# Patient Record
Sex: Female | Born: 1990 | Race: Black or African American | Hispanic: No | Smoking: Current every day smoker
Health system: Southern US, Community
[De-identification: ages and names within clinical notes are randomized; demographics above are authoritative.]

## PROBLEM LIST (undated history)

## (undated) DIAGNOSIS — Z789 Other specified health status: Secondary | ICD-10-CM

## (undated) HISTORY — PX: NO PAST SURGERIES: SHX2092

## (undated) HISTORY — DX: Other specified health status: Z78.9

---

## 2015-06-20 ENCOUNTER — Encounter (HOSPITAL_BASED_OUTPATIENT_CLINIC_OR_DEPARTMENT_OTHER): Payer: Self-pay | Admitting: *Deleted

## 2015-06-20 ENCOUNTER — Emergency Department (HOSPITAL_BASED_OUTPATIENT_CLINIC_OR_DEPARTMENT_OTHER)
Admission: EM | Admit: 2015-06-20 | Discharge: 2015-06-20 | Disposition: A | Discharge status: Home / Self Care | Payer: Medicaid Other | Attending: Emergency Medicine | Admitting: Emergency Medicine

## 2015-06-20 DIAGNOSIS — S8992XD Unspecified injury of left lower leg, subsequent encounter: Secondary | ICD-10-CM | POA: Diagnosis not present

## 2015-06-20 DIAGNOSIS — S3992XD Unspecified injury of lower back, subsequent encounter: Secondary | ICD-10-CM | POA: Insufficient documentation

## 2015-06-20 DIAGNOSIS — S199XXD Unspecified injury of neck, subsequent encounter: Secondary | ICD-10-CM | POA: Insufficient documentation

## 2015-06-20 DIAGNOSIS — S4992XD Unspecified injury of left shoulder and upper arm, subsequent encounter: Secondary | ICD-10-CM | POA: Insufficient documentation

## 2015-06-20 DIAGNOSIS — Z79899 Other long term (current) drug therapy: Secondary | ICD-10-CM | POA: Insufficient documentation

## 2015-06-20 DIAGNOSIS — M25512 Pain in left shoulder: Secondary | ICD-10-CM

## 2015-06-20 DIAGNOSIS — M25562 Pain in left knee: Secondary | ICD-10-CM
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
## 2019-07-13 ENCOUNTER — Emergency Department (HOSPITAL_COMMUNITY)
Admission: EM | Admit: 2019-07-13 | Discharge: 2019-07-13 | Disposition: A | Discharge status: Home / Self Care | Payer: Self-pay | Attending: Emergency Medicine | Admitting: Emergency Medicine

## 2019-07-13 ENCOUNTER — Encounter (HOSPITAL_COMMUNITY): Payer: Self-pay | Admitting: *Deleted

## 2019-07-13 DIAGNOSIS — R1111 Vomiting without nausea: Secondary | ICD-10-CM | POA: Diagnosis not present

## 2019-07-13 DIAGNOSIS — R1084 Generalized abdominal pain: Secondary | ICD-10-CM | POA: Insufficient documentation

## 2019-07-13 DIAGNOSIS — R197 Diarrhea, unspecified: Secondary | ICD-10-CM | POA: Diagnosis not present

## 2019-07-13 DIAGNOSIS — R110 Nausea: Secondary | ICD-10-CM | POA: Diagnosis not present

## 2019-07-13 DIAGNOSIS — R112 Nausea with vomiting, unspecified: Secondary | ICD-10-CM | POA: Insufficient documentation

## 2019-07-13 DIAGNOSIS — R52 Pain, unspecified: Secondary | ICD-10-CM | POA: Diagnosis not present

## 2019-07-13 LAB — URINALYSIS, ROUTINE W REFLEX MICROSCOPIC
Bacteria, UA: NONE SEEN
Bilirubin Urine: NEGATIVE
Glucose, UA: NEGATIVE mg/dL
Ketones, ur: 20 mg/dL — AB
Nitrite: NEGATIVE
Protein, ur: 30 mg/dL — AB
RBC / HPF: >50 RBC/hpf — ABNORMAL HIGH (ref 0–5)
Specific Gravity, Urine: 1.021 (ref 1.005–1.030)
pH: 7 (ref 5.0–8.0)

## 2019-07-13 LAB — CBC WITH DIFFERENTIAL/PLATELET
Abs Immature Granulocytes: 0.08 10*3/uL — ABNORMAL HIGH (ref 0.00–0.07)
Basophils Absolute: 0 10*3/uL (ref 0.0–0.1)
Basophils Relative: 0 %
Eosinophils Absolute: 0 10*3/uL (ref 0.0–0.5)
Eosinophils Relative: 0 %
HCT: 44.7 % (ref 36.0–46.0)
Hemoglobin: 14.5 g/dL (ref 12.0–15.0)
Immature Granulocytes: 1 %
Lymphocytes Relative: 8 %
Lymphs Abs: 0.9 10*3/uL (ref 0.7–4.0)
MCH: 29.4 pg (ref 26.0–34.0)
MCHC: 32.4 g/dL (ref 30.0–36.0)
MCV: 90.5 fL (ref 80.0–100.0)
Monocytes Absolute: 0.6 10*3/uL (ref 0.1–1.0)
Monocytes Relative: 5 %
Neutro Abs: 10.4 10*3/uL — ABNORMAL HIGH (ref 1.7–7.7)
Neutrophils Relative %: 86 %
Platelets: 263 10*3/uL (ref 150–400)
RBC: 4.94 MIL/uL (ref 3.87–5.11)
RDW: 13.1 % (ref 11.5–15.5)
WBC: 12 10*3/uL — ABNORMAL HIGH (ref 4.0–10.5)
nRBC: 0 % (ref 0.0–0.2)

## 2019-07-13 LAB — I-STAT BETA HCG BLOOD, ED (MC, WL, AP ONLY): I-stat hCG, quantitative: <5 m[IU]/mL (ref <5)

## 2019-07-13 LAB — COMPREHENSIVE METABOLIC PANEL
ALT: 18 U/L (ref 0–44)
AST: 26 U/L (ref 15–41)
Albumin: 4.2 g/dL (ref 3.5–5.0)
Alkaline Phosphatase: 47 U/L (ref 38–126)
Anion gap: 12 (ref 5–15)
BUN: 10 mg/dL (ref 6–20)
CO2: 19 mmol/L — ABNORMAL LOW (ref 22–32)
Calcium: 9.4 mg/dL (ref 8.9–10.3)
Chloride: 107 mmol/L (ref 98–111)
Creatinine, Ser: 0.98 mg/dL (ref 0.44–1.00)
GFR calc Af Amer: >60 mL/min (ref >60)
GFR calc non Af Amer: >60 mL/min (ref >60)
Glucose, Bld: 141 mg/dL — ABNORMAL HIGH (ref 70–99)
Potassium: 3.2 mmol/L — ABNORMAL LOW (ref 3.5–5.1)
Sodium: 138 mmol/L (ref 135–145)
Total Bilirubin: 0.6 mg/dL (ref 0.3–1.2)
Total Protein: 7.9 g/dL (ref 6.5–8.1)

## 2019-07-13 LAB — LIPASE, BLOOD: Lipase: 25 U/L (ref 11–51)

## 2019-07-13 MED ORDER — CAPSAICIN 0.025 % EX CREA
TOPICAL_CREAM | Freq: Once | CUTANEOUS | Status: AC
  Administered 2019-07-13: 14:00:00 via TOPICAL
  Filled 2019-07-13: qty 60

## 2019-07-13 MED ORDER — POTASSIUM CHLORIDE CRYS ER 20 MEQ PO TBCR
40.0000 meq | EXTENDED_RELEASE_TABLET | Freq: Once | ORAL | Status: AC
  Administered 2019-07-13: 40 meq via ORAL
  Filled 2019-07-13: qty 2

## 2019-07-13 MED ORDER — SODIUM CHLORIDE 0.9 % IV BOLUS
1000.0000 mL | Freq: Once | INTRAVENOUS | Status: AC
  Administered 2019-07-13: 1000 mL via INTRAVENOUS

## 2019-07-13 MED ORDER — METOCLOPRAMIDE HCL 10 MG PO TABS: 10.0000 mg | ORAL_TABLET | Freq: Three times a day (TID) | ORAL | 0 refills | Status: DC | PRN

## 2019-07-13 MED ORDER — ONDANSETRON HCL 4 MG/2ML IJ SOLN
4.0000 mg | Freq: Once | INTRAMUSCULAR | Status: AC
  Administered 2019-07-13: 4 mg via INTRAVENOUS
  Filled 2019-07-13: qty 2

## 2019-07-13 MED ORDER — METOCLOPRAMIDE HCL 5 MG/ML IJ SOLN
10.0000 mg | Freq: Once | INTRAMUSCULAR | Status: AC
  Administered 2019-07-13: 10 mg via INTRAVENOUS
  Filled 2019-07-13: qty 2

## 2019-07-13 MED ORDER — ACETAMINOPHEN 325 MG PO TABS
650.0000 mg | ORAL_TABLET | Freq: Once | ORAL | Status: AC
  Administered 2019-07-13: 650 mg via ORAL
  Filled 2019-07-13: qty 2

## 2019-07-13 NOTE — ED Triage Notes (Signed)
Per EMS, pt from home complains of emesis, diarrhea, generalized abdominal pain since this morning. Pt vomited yellow bile twice w/ EMS. Pt had negative pregnancy test last week but is unsure if she is pregnant. No fevers or sick contacts.   BP 124/76 HR 82 RR 20 O2 97% CBG 165 Temp 97.2

## 2019-07-13 NOTE — Discharge Instructions (Addendum)
Take Reglan every 6 hours as needed for nausea or vomiting.  Stick with clear liquids and then progress your diet as tolerated.  Begin with bland foods such as bananas, rice, applesauce, toast.  Avoid greasy, fatty, spicy, fried foods until you tolerate bland foods.  Make sure to stay well-hydrated.  Please return the emergency department if you develop any new or worsening symptoms including intractable vomiting, severe localized abdominal pain, fever, or any other new concerning symptoms.  I recommend having your blood sugar rechecked when you see your primary care provider next visit was mildly elevated today if you had been fasting prior to your blood draw.

## 2019-07-13 NOTE — ED Provider Notes (Signed)
[facility]-EMERGENCY DEPT Provider Note   CSN: [id number] Arrival date & time: 07/13/19  [id number]    History   Chief Complaint Chief Complaint  Patient presents with  . Abdominal Pain  . Emesis  . Diarrhea    HPI Olivia Mcfarland is a 28 y.o. female who is previously healthy who presents with nausea, vomiting, diarrhea, lower abdominal cramping that began around 5 AM this morning.  Patient reports vomiting about 20 times and having 2 episodes of nonbloody diarrhea.  Patient reports feeling fine before she went to bed last night.  Patient reports eating [employer] around 6 PM and did not really sit right.  Patient denies any fevers, chest pain, shortness of breath, urinary symptoms, abnormal vaginal bleeding or discharge.  She is on her menstrual cycle, which she states is late.  She took a pregnancy test last week and was negative.  Patient reports using marijuana daily.     HPI  History reviewed. No pertinent past medical history.  There are no active problems to display for this patient.   History reviewed. No pertinent surgical history.   OB History   No obstetric history on file.      Home Medications    Prior to Admission medications   Medication Sig Start Date End Date Taking? Authorizing Provider  ibuprofen (ADVIL,MOTRIN) 800 MG tablet Take 800 mg by mouth every 8 (eight) hours as needed.    [provider]  methocarbamol (ROBAXIN) 500 MG tablet Take 500 mg by mouth 4 (four) times daily.    [provider]  metoCLOPramide (REGLAN) 10 MG tablet Take 1 tablet (10 mg total) by mouth every 8 (eight) hours as needed for nausea. 07/13/19   Emi HolesLaw, Rafan Sanders M, PA-C    Family History No family history on file.  Social History Social History   Tobacco Use  . Smoking status: Never Smoker  . Smokeless tobacco: Never Used  Substance Use Topics  . Alcohol use: No  . Drug use: Not on file     Allergies   Patient has no known  allergies.   Review of Systems Review of Systems  Constitutional: Negative for chills and fever.  HENT: Negative for facial swelling and sore throat.   Respiratory: Negative for shortness of breath.   Cardiovascular: Negative for chest pain.  Gastrointestinal: Positive for abdominal pain, diarrhea, nausea and vomiting. Negative for blood in stool.  Genitourinary: Positive for vaginal bleeding (menstrual cycle). Negative for dysuria and vaginal discharge.  Musculoskeletal: Negative for back pain.  Skin: Negative for rash and wound.  Neurological: Negative for headaches.  Psychiatric/Behavioral: The patient is not nervous/anxious.      Physical Exam Updated Vital Signs BP 136/74   Pulse 70   Temp 98.3 F (36.8 C) (Oral)   Resp 20   LMP 05/22/2019   SpO2 100%   Physical Exam Vitals signs and nursing note reviewed.  Constitutional:      General: She is not in acute distress.    Appearance: She is well-developed. She is not diaphoretic.  HENT:     Head: Normocephalic and atraumatic.     Mouth/Throat:     Pharynx: No oropharyngeal exudate.  Eyes:     General: No scleral icterus.       Right eye: No discharge.        Left eye: No discharge.     Conjunctiva/sclera: Conjunctivae normal.     Pupils: Pupils are equal, round, and reactive to light.  Neck:  Musculoskeletal: Normal range of motion and neck supple.     Thyroid: No thyromegaly.  Cardiovascular:     Rate and Rhythm: Normal rate and regular rhythm.     Heart sounds: Normal heart sounds. No murmur. No friction rub. No gallop.   Pulmonary:     Effort: Pulmonary effort is normal. No respiratory distress.     Breath sounds: Normal breath sounds. No stridor. No wheezing or rales.  Abdominal:     General: Bowel sounds are normal. There is no distension.     Palpations: Abdomen is soft.     Tenderness: There is abdominal tenderness in the suprapubic area and left lower quadrant. There is no right CVA tenderness, left  CVA tenderness, guarding or rebound. Negative signs include McBurney's sign.  Lymphadenopathy:     Cervical: No cervical adenopathy.  Skin:    General: Skin is warm and dry.     Coloration: Skin is not pale.     Findings: No rash.  Neurological:     Mental Status: She is alert.     Coordination: Coordination normal.      ED Treatments / Results  Labs (all labs ordered are listed, but only abnormal results are displayed) Labs Reviewed  COMPREHENSIVE METABOLIC PANEL - Abnormal; Notable for the following components:      Result Value   Potassium 3.2 (*)    CO2 19 (*)    Glucose, Bld 141 (*)    All other components within normal limits  CBC WITH DIFFERENTIAL/PLATELET - Abnormal; Notable for the following components:   WBC 12.0 (*)    Neutro Abs 10.4 (*)    Abs Immature Granulocytes 0.08 (*)    All other components within normal limits  URINALYSIS, ROUTINE W REFLEX MICROSCOPIC - Abnormal; Notable for the following components:   APPearance HAZY (*)    Hgb urine dipstick LARGE (*)    Ketones, ur 20 (*)    Protein, ur 30 (*)    Leukocytes,Ua TRACE (*)    RBC / HPF >50 (*)    All other components within normal limits  LIPASE, BLOOD  I-STAT BETA HCG BLOOD, ED ([facility], [facility], [facility] ONLY)    EKG EKG Interpretation  Date/Time:  Friday July 13 2019 11:47:04 EDT Ventricular Rate:  82 PR Interval:    QRS Duration: 125 QT Interval:  404 QTC Calculation: 455 R Axis:   57 Text Interpretation:  Sinus rhythm Ventricular premature complex Nonspecific intraventricular conduction delay Borderline T abnormalities, diffuse leads No old tracing to compare Confirmed by Mancel BaleWentz, Elliott [phone]) on 07/13/2019 12:23:58 PM   Radiology No results found.  Procedures Procedures (including critical care time)  Medications Ordered in ED Medications  ondansetron (ZOFRAN) injection 4 mg (4 mg Intravenous Given 07/13/19 1035)  sodium chloride 0.9 % bolus 1,000 mL (0 mLs Intravenous Stopped 07/13/19 1406)   metoCLOPramide (REGLAN) injection 10 mg (10 mg Intravenous Given 07/13/19 1225)  capsaicin (ZOSTRIX) 0.025 % cream ( Topical Given 07/13/19 1347)  acetaminophen (TYLENOL) tablet 650 mg (650 mg Oral Given 07/13/19 1302)  potassium chloride SA (K-DUR) CR tablet 40 mEq (40 mEq Oral Given 07/13/19 1347)     Initial Impression / Assessment and Plan / ED Course  I have reviewed the triage vital signs and the nursing notes.  Pertinent labs & imaging results that were available during my care of the patient were reviewed by me and considered in my medical decision making (see chart for details).  Patient presenting with nausea, vomiting, diarrhea, and abdominal cramping.  Patient is feeling much improved after Reglan, IV fluids.  She is tolerating oral fluids.  Patient also given capsaicin.  Labs are unremarkable except for mild hypokalemia, which was replaced.  Mild leukocytosis at 12.0.  Patient has no focal abdominal tenderness.  Suspect viral or food related gastroenteritis or cannabis hyperemesis.  Patient is feeling better and would like to go home and rest.  Progressive diet discussed.  Will discharge home with Reglan.  QTC borderline, but not significant.  Hydration discussed.  Strict return precautions given including intractable vomiting, focal abdominal pain.  Patient understands and agrees with plan.  Patient vital stable throughout ED course and discharged in satisfactory condition.  Final Clinical Impressions(s) / ED Diagnoses   Final diagnoses:  Nausea vomiting and diarrhea    ED Discharge Orders         Ordered    metoCLOPramide (REGLAN) 10 MG tablet  Every 8 hours PRN     07/13/19 1402           Frederica Kuster, PA-C 07/13/19 1614    Daleen Bo, MD 07/13/19 [phone]

## 2020-11-18 ENCOUNTER — Other Ambulatory Visit: Payer: Self-pay

## 2020-11-18 ENCOUNTER — Ambulatory Visit (INDEPENDENT_AMBULATORY_CARE_PROVIDER_SITE_OTHER): Payer: Medicaid Other | Admitting: *Deleted

## 2020-11-18 VITALS — BP 126/78 | HR 76 | Temp 99.0°F | Ht 62.0 in | Wt 186.6 lb

## 2020-11-18 DIAGNOSIS — Z3480 Encounter for supervision of other normal pregnancy, unspecified trimester: Secondary | ICD-10-CM | POA: Diagnosis not present

## 2020-11-18 DIAGNOSIS — Z3201 Encounter for pregnancy test, result positive: Secondary | ICD-10-CM | POA: Diagnosis not present

## 2020-11-18 LAB — POCT URINE PREGNANCY: Preg Test, Ur: POSITIVE — AB

## 2020-11-18 MED ORDER — GOJJI WEIGHT SCALE MISC: 1.0000 | Freq: Every day | 0 refills | Status: DC | PRN

## 2020-11-18 NOTE — Progress Notes (Signed)
   Location: Patient: [facility] Provider: [facility]  PRENATAL INTAKE SUMMARY  Ms. Dunn presents today New OB Nurse Interview.  OB History    Gravida  3   Para  2   Term  2   Preterm      AB      Living  2     SAB      TAB      Ectopic      Multiple      Live Births  2          I have reviewed the patient's medical, obstetrical, social, and family histories, medications, and available lab results.  SUBJECTIVE She complains of body rash that appeared about 1 week ago. Patient has tried Benadryl and calamine lotion with no relief.  OBJECTIVE Initial Nurse interview for history/labs (New OB)  EDD: 05/23/2021 by LMP GA: [redacted]w[redacted]d [id number]  GENERAL APPEARANCE: alert, well appearing, in no apparent distress, oriented to person, place and time   ASSESSMENT Positive UPT Normal pregnancy  PLAN Prenatal care:  [facility] to be completed at next appointment with Raelyn Mora, CNM 12/10/20. Rx for BP monitor and weight scale sent to [facility] Pt to have viability scan at [facility] on Thursday 11/20/2020. Continue PHV Advised patient to go to [facility] for body rash.  Follow Up Instructions:   I discussed the assessment and treatment plan with the patient. The patient was provided an opportunity to ask questions and all were answered. The patient agreed with the plan and demonstrated an understanding of the instructions.   The patient was advised to call back or seek an in-person evaluation if the symptoms worsen or if the condition fails to improve as anticipated.  I provided 35 minutes of  face-to-face time during this encounter.  Clovis Pu, RN

## 2020-11-19 ENCOUNTER — Ambulatory Visit (HOSPITAL_COMMUNITY)
Admission: EM | Admit: 2020-11-19 | Discharge: 2020-11-19 | Disposition: A | Discharge status: Home / Self Care | Payer: Medicaid Other | Attending: Emergency Medicine | Admitting: Emergency Medicine

## 2020-11-19 ENCOUNTER — Encounter (HOSPITAL_COMMUNITY): Payer: Self-pay

## 2020-11-19 ENCOUNTER — Other Ambulatory Visit: Payer: Self-pay

## 2020-11-19 DIAGNOSIS — Z419 Encounter for procedure for purposes other than remedying health state, unspecified: Secondary | ICD-10-CM | POA: Diagnosis not present

## 2020-11-19 DIAGNOSIS — O2686 Pruritic urticarial papules and plaques of pregnancy (PUPPP): Secondary | ICD-10-CM | POA: Diagnosis not present

## 2020-11-19 MED ORDER — TRIAMCINOLONE ACETONIDE 0.1 % EX CREA: 1.0000 "application " | TOPICAL_CREAM | Freq: Two times a day (BID) | CUTANEOUS | 0 refills | Status: DC

## 2020-11-19 NOTE — Discharge Instructions (Addendum)
Apply topical triamcinolone as directed

## 2020-11-19 NOTE — ED Provider Notes (Signed)
____________________________________________  Time seen: Approximately 9:53 AM  I have reviewed the triage vital signs and the nursing notes.   HISTORY  Chief Complaint Rash   Historian Patient    HPI Olivia Mcfarland is a 29 y.o. female presents to the urgent care with pruritic papules and plaques.  Patient states that she has had similar rash with prior pregnancies.  She describes rash as pruritic.  There are no other contacts in her home with similar symptoms.  New viral URI like symptoms as a prodrome to rash.  No tick bites.  No new contact exposures to patient's knowledge.  No chest pain, chest tightness, shortness of breath, wheezing, vomiting, diarrhea or syncope.  Patient denies vaginal bleeding or abdominal pain.    History reviewed. No pertinent past medical history.   Immunizations up to date:  Yes.     History reviewed. No pertinent past medical history.  Patient Active Problem List   Diagnosis Date Noted  . Supervision of other normal pregnancy, antepartum 11/18/2020    Past Surgical History:  Procedure Laterality Date  . NO PAST SURGERIES      Prior to Admission medications   Medication Sig Start Date End Date Taking? Authorizing Provider  ibuprofen (ADVIL,MOTRIN) 800 MG tablet Take 800 mg by mouth every 8 (eight) hours as needed. Patient not taking: Reported on 11/18/2020    [provider]  methocarbamol (ROBAXIN) 500 MG tablet Take 500 mg by mouth 4 (four) times daily. Patient not taking: Reported on 11/18/2020    [provider]  metoCLOPramide (REGLAN) 10 MG tablet Take 1 tablet (10 mg total) by mouth every 8 (eight) hours as needed for nausea. Patient not taking: Reported on 11/18/2020 07/13/19   Emi Holes, PA-C  Misc. Devices (GOJJI WEIGHT SCALE) MISC 1 Device by Does not apply route daily as needed. To weight self daily as needed at home. ICD-10 code: Z34.90 11/18/20   Raelyn Mora, CNM  Misc. Devices (GOJJI WEIGHT  SCALE) MISC 1 Device by Does not apply route daily as needed. To weight self daily as needed at home. ICD-10 code: Z34.90 11/18/20   Raelyn Mora, CNM  triamcinolone (KENALOG) 0.1 % Apply 1 application topically 2 (two) times daily. 11/19/20   Orvil Feil, PA-C    Allergies Patient has no known allergies.  Family History  Problem Relation Age of Onset  . Ovarian cancer Mother     Social History Social History   Tobacco Use  . Smoking status: Never Smoker  . Smokeless tobacco: Never Used  Vaping Use  . Vaping Use: Never used  Substance Use Topics  . Alcohol use: No  . Drug use: Yes    Types: Marijuana    Comment: Last time 1 week ago     Review of Systems  Constitutional: No fever/chills Eyes:  No discharge ENT: No upper respiratory complaints. Respiratory: no cough. No SOB/ use of accessory muscles to breath Gastrointestinal:   No nausea, no vomiting.  No diarrhea.  No constipation. Musculoskeletal: Negative for musculoskeletal pain. Skin: Patient has rash.    ____________________________________________   PHYSICAL EXAM:  VITAL SIGNS: ED Triage Vitals  Enc Vitals Group     BP 11/19/20 0905 100/62     Pulse Rate 11/19/20 0905 75     Resp 11/19/20 0905 20     Temp 11/19/20 0905 98.8 F (37.1 C)     Temp Source 11/19/20 0905 Oral     SpO2 11/19/20 0905 98 %  Weight --      Height --      Head Circumference --      Peak Flow --      Pain Score 11/19/20 0903 0     Pain Loc --      Pain Edu? --      Excl. in GC? --      Constitutional: Alert and oriented. Well appearing and in no acute distress. Eyes: Conjunctivae are normal. PERRL. EOMI. Head: Atraumatic. Cardiovascular: Normal rate, regular rhythm. Normal S1 and S2.  Good peripheral circulation. Respiratory: Normal respiratory effort without tachypnea or retractions. Lungs CTAB. Good air entry to the bases with no decreased or absent breath sounds Gastrointestinal: Bowel sounds x 4 quadrants.  Soft and nontender to palpation. No guarding or rigidity. No distention. Musculoskeletal: Full range of motion to all extremities. No obvious deformities noted Neurologic:  Normal for age. No gross focal neurologic deficits are appreciated.  Skin: Patient has diffuse, papular, dry plaques across abdomen and chest. Psychiatric: Mood and affect are normal for age. Speech and behavior are normal.   ____________________________________________   LABS (all labs ordered are listed, but only abnormal results are displayed)  Labs Reviewed - No data to display ____________________________________________  EKG   ____________________________________________  RADIOLOGY  No results found.  ____________________________________________    PROCEDURES  Procedure(s) performed:     Procedures     Medications - No data to display   ____________________________________________   INITIAL IMPRESSION / ASSESSMENT AND PLAN / ED COURSE  Pertinent labs & imaging results that were available during my care of the patient were reviewed by me and considered in my medical decision making (see chart for details).    Assessment and plan Paretic papules and plaques of pregnancy 29 year old female presents to the urgent care with pruritic papules and plaques of pregnancy.  Will treat with topical steroid and will reassess.  All patient questions were answered.   ____________________________________________  FINAL CLINICAL IMPRESSION(S) / ED DIAGNOSES  Final diagnoses:  Pruritic urticarial papules and plaques of pregnancy      NEW MEDICATIONS STARTED DURING THIS VISIT:  ED Discharge Orders         Ordered    triamcinolone (KENALOG) 0.1 %  2 times daily        11/19/20 0948              This chart was dictated using voice recognition software/Dragon. Despite best efforts to proofread, errors can occur which can change the meaning. Any change was purely unintentional.      Orvil Feil, [place] 11/19/20 [phone]

## 2020-11-19 NOTE — ED Triage Notes (Signed)
Pt in with c/o generalized rash on neck, chest, abdomen that she noticed last week. States that it is very itchy.  Pt has taken benadryl and applied calamine lotion with no relief.  States that she is pregnant and she had a break out with her last pregnancy but it was not as bad as this one

## 2020-11-20 ENCOUNTER — Ambulatory Visit (INDEPENDENT_AMBULATORY_CARE_PROVIDER_SITE_OTHER): Payer: Medicaid Other

## 2020-11-20 ENCOUNTER — Telehealth (HOSPITAL_COMMUNITY): Payer: Self-pay | Admitting: Emergency Medicine

## 2020-11-20 DIAGNOSIS — Z789 Other specified health status: Secondary | ICD-10-CM

## 2020-11-20 DIAGNOSIS — Z3A13 13 weeks gestation of pregnancy: Secondary | ICD-10-CM

## 2020-11-20 DIAGNOSIS — O3680X0 Pregnancy with inconclusive fetal viability, not applicable or unspecified: Secondary | ICD-10-CM | POA: Diagnosis not present

## 2020-11-20 DIAGNOSIS — Z3480 Encounter for supervision of other normal pregnancy, unspecified trimester: Secondary | ICD-10-CM | POA: Diagnosis not present

## 2020-11-20 MED ORDER — TRIAMCINOLONE ACETONIDE 0.1 % EX CREA: 1.0000 "application " | TOPICAL_CREAM | Freq: Two times a day (BID) | CUTANEOUS | 0 refills | Status: DC

## 2020-11-20 MED ORDER — BLOOD PRESSURE KIT DEVI: 1.0000 | 0 refills | Status: DC

## 2020-11-20 NOTE — Progress Notes (Signed)
DATING AND VIABILITY SONOGRAM   Olivia Mcfarland is a 29 y.o. year old [id number] with LMP Patient's last menstrual period was 08/16/2020. which would correlate to  [redacted]w[redacted]d weeks gestation.  She has regular menstrual cycles.   She is here today for a confirmatory initial sonogram.    GESTATION: 13 weeks SINGLETON     FETAL ACTIVITY:          Heart rate         138          The fetus is active.  PLACENTA LOCALIZATION:  anterior  Gestational criteria: Estimated Date of Delivery: 05/23/21 by LMP now at [redacted]w[redacted]d  Previous Scans:0      CROWN RUMP LENGTH           77 mm         13 weeks                                                                               AVERAGE EGA(BY THIS SCAN):  13 weeks  WORKING EDD( LMP ):  05/23/2021     TECHNICIAN COMMENTS:  Single live IUP at [redacted]w[redacted]d by CRL. FHR 138.    A copy of this report including all images has been saved and backed up to a second source for retrieval if needed. All measures and details of the anatomical scan, placentation, fluid volume and pelvic anatomy are contained in that report.  Amarian Botero J Yuval Nolet 11/20/2020 10:13 AM

## 2020-11-20 NOTE — Progress Notes (Signed)
Patient was assessed and managed by nursing staff during this encounter. I have reviewed the chart and agree with the documentation and plan. I have also made any necessary editorial changes.  Aspin Palomarez A Zilpha Mcandrew, MD 11/20/2020 12:06 PM   

## 2020-12-10 ENCOUNTER — Ambulatory Visit (INDEPENDENT_AMBULATORY_CARE_PROVIDER_SITE_OTHER): Payer: Medicaid Other | Admitting: Obstetrics and Gynecology

## 2020-12-10 ENCOUNTER — Other Ambulatory Visit (HOSPITAL_COMMUNITY)
Admission: RE | Admit: 2020-12-10 | Discharge: 2020-12-10 | Disposition: A | Discharge status: Home / Self Care | Payer: Medicaid Other | Source: Ambulatory Visit | Attending: Obstetrics and Gynecology | Admitting: Obstetrics and Gynecology

## 2020-12-10 ENCOUNTER — Other Ambulatory Visit: Payer: Self-pay

## 2020-12-10 ENCOUNTER — Encounter: Payer: Self-pay | Admitting: Obstetrics and Gynecology

## 2020-12-10 VITALS — BP 98/70 | HR 92 | Temp 97.8°F | Wt 185.4 lb

## 2020-12-10 DIAGNOSIS — Z3A16 16 weeks gestation of pregnancy: Secondary | ICD-10-CM | POA: Insufficient documentation

## 2020-12-10 DIAGNOSIS — Z3480 Encounter for supervision of other normal pregnancy, unspecified trimester: Secondary | ICD-10-CM | POA: Diagnosis not present

## 2020-12-10 DIAGNOSIS — O2686 Pruritic urticarial papules and plaques of pregnancy (PUPPP): Secondary | ICD-10-CM

## 2020-12-10 DIAGNOSIS — Z31430 Encounter of female for testing for genetic disease carrier status for procreative management: Secondary | ICD-10-CM | POA: Diagnosis not present

## 2020-12-10 NOTE — Patient Instructions (Signed)
PUPPP Rash  Pruritic urticarial papules and plaques of pregnancy (PUPPP) is a rash that develops during pregnancy, or sometimes shortly after giving birth. The rash consists of small red bumps that sometimes form large, scaly, red patches of skin (plaques). The rash goes away soon after childbirth, and it does not leave scars or harm you or your baby. PUPPP usually appears during the last few weeks of pregnancy. It usually does not return during later pregnancies. What are the causes? The cause of this condition is not known. However, it may be related to your skin stretching rapidly during the later stages of pregnancy. What increases the risk? You are more likely to develop this condition if:  You are pregnant for the first time.  You are pregnant with more than one baby. What are the signs or symptoms? The main symptom of this condition is a very itchy rash on the abdomen. The rash often looks red and raised, and sometimes small blisters form in the center of the rash patches. The rash may spread to the legs or arms. The skin around the rash may turn pale. How is this diagnosed? This condition is diagnosed based on a physical exam. Your health care provider will examine your skin and ask questions about your symptoms. In some cases, your health care provider may take a sample of your skin (biopsy) for testing in order to rule out other causes of skin conditions. How is this treated? The goal of treatment is to stop the itching and keep the rash from spreading. Common treatment options include medicines that relieve or lessen itching (corticosteroids or antihistamines). These medicines may be:  Creams.  Ointments.  Pills to be taken by mouth (orally). Follow these instructions at home: Skin care  Apply any creams or ointments as directed by your health care provider.  Wear loose clothing.  Keep your skin clean and dry.  Do not scratch the rash.  Do not bathe in hot water. Take  cool baths to soothe your skin. Try adding baking soda or oatmeal to the water to reduce itching.  Apply cool, wet cloths (compresses) to itchy areas as told by your health care provider. General instructions  Take over-the-counter and prescription medicines only as told by your health care provider.  Keep all follow-up visits as told by your health care provider. This is important. Contact a health care provider if:  Your itchiness does not go away after treatment.  Your rash continues to spread.  You are unable to sleep because of the irritation. Summary  Pruritic urticarial papules and plaques of pregnancy (PUPPP) is a rash that develops during pregnancy, or sometimes shortly after giving birth.  The rash goes away soon after giving birth. It does not leave scars or harm you or your baby.  You are more likely to develop this condition if this is your first pregnancy or you are pregnant with more than one baby.  The goal of treatment is to stop the itching and keep the rash from spreading. This information is not intended to replace advice given to you by your health care provider. Make sure you discuss any questions you have with your health care provider. Document Revised: 03/29/2019 Document Reviewed: 02/23/2017 Elsevier Patient Education  2020 [employer].

## 2020-12-10 NOTE — Progress Notes (Signed)
  Subjective:    Olivia Mcfarland is being seen today for her first obstetrical visit.  This is not a planned pregnancy. She is at [redacted]w[redacted]d gestation. Her obstetrical history is significant for PUPP. Relationship with FOB: significant other, living together. Patient does not intend to breast feed. Pregnancy history fully reviewed.  Patient reports no bleeding, no contractions, no cramping and no leaking.  Review of Systems:   Review of Systems  Constitutional: Negative for activity change, fatigue and fever.  HENT: Negative for sneezing and sore throat.   Eyes: Negative for visual disturbance.  Respiratory: Negative for cough and shortness of breath.   Cardiovascular: Negative for palpitations.  Gastrointestinal: Negative for diarrhea, nausea and vomiting.  Genitourinary: Negative for vaginal bleeding and vaginal discharge.  Neurological: Negative for dizziness, light-headedness and headaches.  All other systems reviewed and are negative.   Objective:     BP 98/70   Pulse 92   Temp 97.8 F (36.6 C)   Wt 84.1 kg   LMP 08/16/2020   BMI 33.91 kg/m  Physical Exam Vitals and nursing note reviewed. Exam conducted with a chaperone present.  Constitutional:      Appearance: Normal appearance. She is normal weight.  Eyes:     Pupils: Pupils are equal, round, and reactive to light.  Cardiovascular:     Rate and Rhythm: Normal rate and regular rhythm.     Pulses: Normal pulses.     Heart sounds: Normal heart sounds.  Pulmonary:     Effort: Pulmonary effort is normal.     Breath sounds: Normal breath sounds.  Genitourinary:    General: Normal vulva.  Musculoskeletal:     Cervical back: Normal range of motion.  Skin:    General: Skin is warm and dry.  Neurological:     Mental Status: She is alert and oriented to person, place, and time.  Psychiatric:        Mood and Affect: Mood normal.        Behavior: Behavior normal.        Thought Content: Thought content normal.         Judgment: Judgment normal.     Maternal Exam:  Introitus: Normal vulva.      Assessment:    Pregnancy: [id number] Patient Active Problem List   Diagnosis Date Noted  . Supervision of other normal pregnancy, antepartum 11/18/2020    1. Supervision of other normal pregnancy, antepartum   2. Pregnancy with 16 completed weeks gestation  - Culture, OB Urine - Cervicovaginal ancillary only( [place]) - Cytology - PAP( [place]) - Hemoglobin A1c - AFP, Serum, Open Spina Bifida - Genetic Screening - CBC/D/Plt+RPR+Rh+ABO+Rub Ab...  3. PUPP (pruritic urticarial papules and plaques of pregnancy) -Patient encouraged to stop using Ambien and start using Aveeno products and OTC hydrocortisone 1% cream.     Plan:   Initial labs drawn. Prenatal vitamins. Problem list reviewed and updated. AFP3 discussed: requested. Role of ultrasound in pregnancy discussed; fetal survey: requested. Follow up in 4 weeks. 90% of 45 min visit spent on counseling and coordination of care.    Sande Rives 12/10/2020

## 2020-12-11 LAB — CBC/D/PLT+RPR+RH+ABO+RUB AB...
Antibody Screen: NEGATIVE
Basophils Absolute: 0 10*3/uL (ref 0.0–0.2)
Basos: 0 %
EOS (ABSOLUTE): 0.1 10*3/uL (ref 0.0–0.4)
Eos: 1 %
HCV Ab: 0.1 s/co ratio (ref 0.0–0.9)
HIV Screen 4th Generation wRfx: NONREACTIVE
Hematocrit: 36.7 % (ref 34.0–46.6)
Hemoglobin: 12.1 g/dL (ref 11.1–15.9)
Hepatitis B Surface Ag: NEGATIVE
Immature Grans (Abs): 0.1 10*3/uL (ref 0.0–0.1)
Immature Granulocytes: 1 %
Lymphocytes Absolute: 1.1 10*3/uL (ref 0.7–3.1)
Lymphs: 14 %
MCH: 29.6 pg (ref 26.6–33.0)
MCHC: 33 g/dL (ref 31.5–35.7)
MCV: 90 fL (ref 79–97)
Monocytes Absolute: 0.6 10*3/uL (ref 0.1–0.9)
Monocytes: 8 %
Neutrophils Absolute: 5.6 10*3/uL (ref 1.4–7.0)
Neutrophils: 76 %
Platelets: 217 10*3/uL (ref 150–450)
RBC: 4.09 x10E6/uL (ref 3.77–5.28)
RDW: 12.9 % (ref 11.7–15.4)
RPR Ser Ql: NONREACTIVE
Rh Factor: POSITIVE
Rubella Antibodies, IGG: 1.17 index (ref >0.99)
WBC: 7.5 10*3/uL (ref 3.4–10.8)

## 2020-12-11 LAB — HCV INTERPRETATION

## 2020-12-12 LAB — AFP, SERUM, OPEN SPINA BIFIDA
AFP MoM: 0.83
AFP Value: 26.9 ng/mL
Gest. Age on Collection Date: 16 weeks
Maternal Age At EDD: 28.7 yr
OSBR Risk 1 IN: 10000
Test Results:: NEGATIVE
Weight: 185 [lb_av]

## 2020-12-12 LAB — HEMOGLOBIN A1C
Est. average glucose Bld gHb Est-mCnc: 97 mg/dL
Hgb A1c MFr Bld: 5 % (ref 4.8–5.6)

## 2020-12-12 LAB — URINE CULTURE, OB REFLEX

## 2020-12-12 LAB — CULTURE, OB URINE

## 2020-12-15 LAB — CERVICOVAGINAL ANCILLARY ONLY
Bacterial Vaginitis (gardnerella): POSITIVE — AB
Candida Glabrata: NEGATIVE
Candida Vaginitis: NEGATIVE
Chlamydia: NEGATIVE
Comment: NEGATIVE
Comment: NEGATIVE
Comment: NEGATIVE
Comment: NEGATIVE
Comment: NEGATIVE
Comment: NORMAL
Neisseria Gonorrhea: NEGATIVE
Trichomonas: NEGATIVE

## 2020-12-15 LAB — CYTOLOGY - PAP: Diagnosis: NEGATIVE

## 2020-12-16 ENCOUNTER — Telehealth: Payer: Self-pay | Admitting: *Deleted

## 2020-12-16 DIAGNOSIS — N760 Acute vaginitis: Secondary | ICD-10-CM

## 2020-12-16 MED ORDER — METRONIDAZOLE 500 MG PO TABS: 500.0000 mg | ORAL_TABLET | Freq: Two times a day (BID) | ORAL | 0 refills | Status: DC

## 2020-12-16 NOTE — Telephone Encounter (Signed)
-----   Message from Raelyn Mora, [place] sent at 12/15/2020  2:32 PM EST ----- Please treat for BV

## 2020-12-20 DIAGNOSIS — Z419 Encounter for procedure for purposes other than remedying health state, unspecified: Secondary | ICD-10-CM | POA: Diagnosis not present

## 2020-12-20 NOTE — L&D Delivery Note (Signed)
Delivery Note At 9:37 AM a viable and healthy female was delivered via Vaginal, Spontaneous (Presentation: Right Occiput Anterior).  APGAR: 8, 9; weight pending.   Placenta status: Spontaneous, Intact.  Cord: 3 vessels with the following complications: None.    Anesthesia: Epidural Episiotomy: None Lacerations: None Suture Repair: n/a Est. Blood Loss (mL): 100  Mom to postpartum.  Baby to Couplet care / Skin to Skin.   Olivia Mcfarland is a 30 y.o. female [id number] with IUP at 100w6d admitted for spontaneous labor.  She progressed with augmentation to complete and pushed with one contraction to deliver.  RN delivered baby with CNM present in the room due to precipitous delivery. CNM and PA student then clamped cord (after 3-4 minute delay) for pt s/o to cut and delivered placenta.   Placenta intact and spontaneous, bleeding minimal.  Intact perineum with no repair needed.  Mom and baby stable prior to transfer to postpartum. She plans on breastfeeding. She requests Depo for birth control.   Sharen Counter 05/22/2021, 10:47 AM

## 2020-12-30 ENCOUNTER — Other Ambulatory Visit: Payer: Self-pay | Admitting: *Deleted

## 2020-12-30 DIAGNOSIS — O219 Vomiting of pregnancy, unspecified: Secondary | ICD-10-CM

## 2020-12-30 MED ORDER — PROMETHAZINE HCL 25 MG PO TABS
25.0000 mg | ORAL_TABLET | Freq: Four times a day (QID) | ORAL | 1 refills | Status: DC | PRN
Start: 2020-12-30 — End: 2021-05-23

## 2021-01-08 ENCOUNTER — Encounter: Payer: Medicaid Other | Admitting: Obstetrics and Gynecology

## 2021-01-15 ENCOUNTER — Encounter: Payer: Medicaid Other | Admitting: Obstetrics and Gynecology

## 2021-01-20 DIAGNOSIS — Z419 Encounter for procedure for purposes other than remedying health state, unspecified: Secondary | ICD-10-CM | POA: Diagnosis not present

## 2021-01-28 ENCOUNTER — Other Ambulatory Visit: Payer: Self-pay

## 2021-01-28 ENCOUNTER — Encounter: Payer: Self-pay | Admitting: Student

## 2021-01-28 ENCOUNTER — Ambulatory Visit (INDEPENDENT_AMBULATORY_CARE_PROVIDER_SITE_OTHER): Payer: Medicaid Other | Admitting: Student

## 2021-01-28 VITALS — BP 115/76 | HR 85 | Temp 97.9°F | Wt 187.6 lb

## 2021-01-28 DIAGNOSIS — Z3480 Encounter for supervision of other normal pregnancy, unspecified trimester: Secondary | ICD-10-CM

## 2021-01-28 DIAGNOSIS — Z3A23 23 weeks gestation of pregnancy: Secondary | ICD-10-CM

## 2021-01-28 NOTE — Patient Instructions (Signed)
The Maternity Assessment Unit (MAU) is located at the [facility] at [facility]. The address is: [address], [place], [place], [place] [address]. Please see map below for additional directions.    The Maternity Assessment Unit is designed to help you during your pregnancy, and for up to 6 weeks after delivery, with any pregnancy- or postpartum-related emergencies, if you think you are in labor, or if your water has broken. For example, if you experience nausea and vomiting, vaginal bleeding, severe abdominal or pelvic pain, elevated blood pressure or other problems related to your pregnancy or postpartum time, please come to the Maternity Assessment Unit for assistance.       Second Trimester of Pregnancy  The second trimester of pregnancy is from week 13 through week 27. This is months 4 through 6 of pregnancy. The second trimester is often a time when you feel your best. Your body has adjusted to being pregnant, and you begin to feel better physically. During the second trimester:  Morning sickness has lessened or stopped completely.  You may have more energy.  You may have an increase in appetite. The second trimester is also a time when the unborn baby (fetus) is growing rapidly. At the end of the sixth month, the fetus may be up to 12 inches long and weigh about 1 pounds. You will likely begin to feel the baby move (quickening) between 16 and 20 weeks of pregnancy. Body changes during your second trimester Your body continues to go through many changes during your second trimester. The changes vary and generally return to normal after the baby is born. Physical changes  Your weight will continue to increase. You will notice your lower abdomen bulging out.  You may begin to get stretch marks on your hips, abdomen, and breasts.  Your breasts will continue to grow and to become tender.  Dark spots or blotches (chloasma or mask of pregnancy)  may develop on your face.  A dark line from your belly button to the pubic area (linea nigra) may appear.  You may have changes in your hair. These can include thickening of your hair, rapid growth, and changes in texture. Some people also have hair loss during or after pregnancy, or hair that feels dry or thin. Health changes  You may develop headaches.  You may have heartburn.  You may develop constipation.  You may develop hemorrhoids or swollen, bulging veins (varicose veins).  Your gums may bleed and may be sensitive to brushing and flossing.  You may urinate more often because the fetus is pressing on your bladder.  You may have back pain. This is caused by: ? Weight gain. ? Pregnancy hormones that are relaxing the joints in your pelvis. ? A shift in weight and the muscles that support your balance. Follow these instructions at home: Medicines  Follow your health care provider's instructions regarding medicine use. Specific medicines may be either safe or unsafe to take during pregnancy. Do not take any medicines unless approved by your health care provider.  Take a prenatal vitamin that contains at least 600 micrograms (mcg) of folic acid. Eating and drinking  Eat a healthy diet that includes fresh fruits and vegetables, whole grains, good sources of protein such as meat, eggs, or tofu, and low-fat dairy products.  Avoid raw meat and unpasteurized juice, milk, and cheese. These carry germs that can harm you and your baby.  You may need to take these actions to prevent  or treat constipation: ? Drink enough fluid to keep your urine pale yellow. ? Eat foods that are high in fiber, such as beans, whole grains, and fresh fruits and vegetables. ? Limit foods that are high in fat and processed sugars, such as fried or sweet foods. Activity  Exercise only as directed by your health care provider. Most people can continue their usual exercise routine during pregnancy. Try to  exercise for 30 minutes at least 5 days a week. Stop exercising if you develop contractions in your uterus.  Stop exercising if you develop pain or cramping in the lower abdomen or lower back.  Avoid exercising if it is very hot or humid or if you are at a high altitude.  Avoid heavy lifting.  If you choose to, you may have sex unless your health care provider tells you not to. Relieving pain and discomfort  Wear a supportive bra to prevent discomfort from breast tenderness.  Take warm sitz baths to soothe any pain or discomfort caused by hemorrhoids. Use hemorrhoid cream if your health care provider approves.  Rest with your legs raised (elevated) if you have leg cramps or low back pain.  If you develop varicose veins: ? Wear support hose as told by your health care provider. ? Elevate your feet for 15 minutes, 3-4 times a day. ? Limit salt in your diet. Safety  Wear your seat belt at all times when driving or riding in a car.  Talk with your health care provider if someone is verbally or physically abusive to you. Lifestyle  Do not use hot tubs, steam rooms, or saunas.  Do not douche. Do not use tampons or scented sanitary pads.  Avoid cat litter boxes and soil used by cats. These carry germs that can cause birth defects in the baby and possibly loss of the fetus by miscarriage or stillbirth.  Do not use herbal remedies, alcohol, illegal drugs, or medicines that are not approved by your health care provider. Chemicals in these products can harm your baby.  Do not use any products that contain nicotine or tobacco, such as cigarettes, e-cigarettes, and chewing tobacco. If you need help quitting, ask your health care provider. General instructions  During a routine prenatal visit, your health care provider will do a physical exam and other tests. He or she will also discuss your overall health. Keep all follow-up visits. This is important.  Ask your health care provider for a  referral to a local prenatal education class.  Ask for help if you have counseling or nutritional needs during pregnancy. Your health care provider can offer advice or refer you to specialists for help with various needs. Where to find more information  American Pregnancy Association: americanpregnancy.org  [employer]necologists: https://www.todd-brady.net/  Office on [employer] [facility]: MightyReward.co.nz Contact a health care provider if you have:  A headache that does not go away when you take medicine.  Vision changes or you see spots in front of your eyes.  Mild pelvic cramps, pelvic pressure, or nagging pain in the abdominal area.  Persistent nausea, vomiting, or diarrhea.  A bad-smelling vaginal discharge or foul-smelling urine.  Pain when you urinate.  Sudden or extreme swelling of your face, hands, ankles, feet, or legs.  A fever. Get help right away if you:  Have fluid leaking from your vagina.  Have spotting or bleeding from your vagina.  Have severe abdominal cramping or pain.  Have difficulty breathing.  Have chest pain.  Have fainting spells.  Have not felt your baby move for the time period told by your health care provider.  Have new or increased pain, swelling, or redness in an arm or leg. Summary  The second trimester of pregnancy is from week 13 through week 27 (months 4 through 6).  Do not use herbal remedies, alcohol, illegal drugs, or medicines that are not approved by your health care provider. Chemicals in these products can harm your baby.  Exercise only as directed by your health care provider. Most people can continue their usual exercise routine during pregnancy.  Keep all follow-up visits. This is important. This information is not intended to replace advice given to you by your health care provider. Make sure you discuss any questions you have with your health care provider. Document  Revised: 05/14/2020 Document Reviewed: 03/20/2020 Elsevier Patient Education  2021 [employer].

## 2021-01-28 NOTE — Progress Notes (Signed)
   PRENATAL VISIT NOTE  Subjective:  Olivia Mcfarland is a 30 y.o. G3P2002 at [redacted]w[redacted]d being seen today for ongoing prenatal care.  She is currently monitored for the following issues for this low-risk pregnancy and has Supervision of other normal pregnancy, antepartum on their problem list.  Patient reports no complaints.  Contractions: Not present. Vag. Bleeding: None.  Movement: Present. Denies leaking of fluid.   The following portions of the patient's history were reviewed and updated as appropriate: allergies, current medications, past family history, past medical history, past social history, past surgical history and problem list.   Objective:   Vitals:   01/28/21 0928  BP: 115/76  Pulse: 85  Temp: 97.9 F (36.6 C)  Weight: 187 lb 9.6 oz (85.1 kg)    Fetal Status: Fetal Heart Rate (bpm): 135 Fundal Height: 24 cm Movement: Present     General:  Alert, oriented and cooperative. Patient is in no acute distress.  Skin: Skin is warm and dry. No rash noted.   Cardiovascular: Normal heart rate noted  Respiratory: Normal respiratory effort, no problems with respiration noted  Abdomen: Soft, gravid, appropriate for gestational age.  Pain/Pressure: Absent     Pelvic: Cervical exam deferred        Extremities: Normal range of motion.  Edema: None  Mental Status: Normal mood and affect. Normal behavior. Normal judgment and thought content.   Assessment and Plan:  Pregnancy: G3P2002 at [redacted]w[redacted]d 1. Supervision of other normal pregnancy, antepartum -some lapse in care since initial prenatal. Reports doing well since last appt. States she's been able to see her results so far & has no questions. Will get anatomy ultrasound scheduled. Discussed upcoming appointments.   - [place] MFM OB COMP + 14 WK; Future  Preterm labor symptoms and general obstetric precautions including but not limited to vaginal bleeding, contractions, leaking of fluid and fetal movement were reviewed in detail with the  patient. Please refer to After Visit Summary for other counseling recommendations.   Return in about 4 weeks (around 02/25/2021) for Routine OB, in person, fasting labs.  Future Appointments  Date Time Provider Department Center  02/03/2021  7:30 AM [facility] NURSE [facility]  02/03/2021  7:45 AM [facility] US4 [facility]  02/26/2021  8:50 AM Judeth Horn, NP [facility] None    Judeth Horn, NP

## 2021-02-03 ENCOUNTER — Ambulatory Visit: Payer: Medicaid Other | Attending: Obstetrics and Gynecology

## 2021-02-03 ENCOUNTER — Other Ambulatory Visit: Payer: Self-pay | Admitting: *Deleted

## 2021-02-03 ENCOUNTER — Encounter: Payer: Self-pay | Admitting: *Deleted

## 2021-02-03 ENCOUNTER — Other Ambulatory Visit: Payer: Self-pay

## 2021-02-03 ENCOUNTER — Ambulatory Visit: Payer: Medicaid Other | Admitting: *Deleted

## 2021-02-03 DIAGNOSIS — Z6834 Body mass index (BMI) 34.0-34.9, adult: Secondary | ICD-10-CM

## 2021-02-03 DIAGNOSIS — Z3480 Encounter for supervision of other normal pregnancy, unspecified trimester: Secondary | ICD-10-CM | POA: Insufficient documentation

## 2021-02-17 DIAGNOSIS — Z419 Encounter for procedure for purposes other than remedying health state, unspecified: Secondary | ICD-10-CM | POA: Diagnosis not present

## 2021-02-26 ENCOUNTER — Encounter: Payer: Medicaid Other | Admitting: Student

## 2021-03-03 ENCOUNTER — Ambulatory Visit: Payer: Medicaid Other | Admitting: *Deleted

## 2021-03-03 ENCOUNTER — Other Ambulatory Visit: Payer: Self-pay

## 2021-03-03 ENCOUNTER — Ambulatory Visit: Payer: Medicaid Other | Attending: Obstetrics

## 2021-03-03 ENCOUNTER — Other Ambulatory Visit: Payer: Self-pay | Admitting: *Deleted

## 2021-03-03 ENCOUNTER — Encounter: Payer: Self-pay | Admitting: *Deleted

## 2021-03-03 DIAGNOSIS — Z3480 Encounter for supervision of other normal pregnancy, unspecified trimester: Secondary | ICD-10-CM

## 2021-03-03 DIAGNOSIS — Z363 Encounter for antenatal screening for malformations: Secondary | ICD-10-CM | POA: Diagnosis not present

## 2021-03-03 DIAGNOSIS — O0933 Supervision of pregnancy with insufficient antenatal care, third trimester: Secondary | ICD-10-CM

## 2021-03-03 DIAGNOSIS — O99213 Obesity complicating pregnancy, third trimester: Secondary | ICD-10-CM | POA: Insufficient documentation

## 2021-03-03 DIAGNOSIS — Z3A28 28 weeks gestation of pregnancy: Secondary | ICD-10-CM

## 2021-03-03 DIAGNOSIS — E669 Obesity, unspecified: Secondary | ICD-10-CM | POA: Diagnosis not present

## 2021-03-03 DIAGNOSIS — Z6834 Body mass index (BMI) 34.0-34.9, adult: Secondary | ICD-10-CM

## 2021-03-04 ENCOUNTER — Ambulatory Visit (INDEPENDENT_AMBULATORY_CARE_PROVIDER_SITE_OTHER): Payer: Medicaid Other | Admitting: Advanced Practice Midwife

## 2021-03-04 ENCOUNTER — Encounter: Payer: Self-pay | Admitting: Advanced Practice Midwife

## 2021-03-04 VITALS — BP 126/85 | HR 98 | Temp 98.4°F | Wt 196.8 lb

## 2021-03-04 DIAGNOSIS — Z3A28 28 weeks gestation of pregnancy: Secondary | ICD-10-CM

## 2021-03-04 DIAGNOSIS — Z3480 Encounter for supervision of other normal pregnancy, unspecified trimester: Secondary | ICD-10-CM | POA: Diagnosis not present

## 2021-03-04 NOTE — Progress Notes (Signed)
   PRENATAL VISIT NOTE  Subjective:  Olivia Mcfarland is a 30 y.o. G3P2002 at [redacted]w[redacted]d being seen today for ongoing prenatal care.  She is currently monitored for the following issues for this low-risk pregnancy and has Supervision of other normal pregnancy, antepartum on their problem list.  Patient reports no complaints.  Contractions: Not present. Vag. Bleeding: None.  Movement: Present. Denies leaking of fluid.   The following portions of the patient's history were reviewed and updated as appropriate: allergies, current medications, past family history, past medical history, past social history, past surgical history and problem list.   Objective:   Vitals:   03/04/21 0824  BP: 126/85  Pulse: 98  Temp: 98.4 F (36.9 C)  Weight: 196 lb 12.8 oz (89.3 kg)    Fetal Status: Fetal Heart Rate (bpm): 142   Movement: Present     General:  Alert, oriented and cooperative. Patient is in no acute distress.  Skin: Skin is warm and dry. No rash noted.   Cardiovascular: Normal heart rate noted  Respiratory: Normal respiratory effort, no problems with respiration noted  Abdomen: Soft, gravid, appropriate for gestational age.  Pain/Pressure: Absent     Pelvic: Cervical exam deferred        Extremities: Normal range of motion.  Edema: None  Mental Status: Normal mood and affect. Normal behavior. Normal judgment and thought content.   Assessment and Plan:  Pregnancy: G3P2002 at [redacted]w[redacted]d 1. Supervision of other normal pregnancy, antepartum - Glucose Tolerance, 2 Hours w/1 Hour - HIV Antibody (routine testing w rflx) - RPR - CBC  2. [redacted] weeks gestation of pregnancy   Preterm labor symptoms and general obstetric precautions including but not limited to vaginal bleeding, contractions, leaking of fluid and fetal movement were reviewed in detail with the patient. Please refer to After Visit Summary for other counseling recommendations.   Return in about 2 weeks (around 03/18/2021).  Future  Appointments  Date Time Provider Department Center  03/04/2021 10:50 AM Thressa Sheller D, CNM [facility] None  03/31/2021  9:30 AM [facility] NURSE [facility]  03/31/2021  9:45 AM [facility] US5 [facility]   Thressa Sheller DNP, CNM  03/04/21  8:56 AM

## 2021-03-05 LAB — CBC
Hematocrit: 33.5 % — ABNORMAL LOW (ref 34.0–46.6)
Hemoglobin: 11.1 g/dL (ref 11.1–15.9)
MCH: 30.3 pg (ref 26.6–33.0)
MCHC: 33.1 g/dL (ref 31.5–35.7)
MCV: 92 fL (ref 79–97)
Platelets: 180 10*3/uL (ref 150–450)
RBC: 3.66 x10E6/uL — ABNORMAL LOW (ref 3.77–5.28)
RDW: 13.4 % (ref 11.7–15.4)
WBC: 9.8 10*3/uL (ref 3.4–10.8)

## 2021-03-05 LAB — GLUCOSE TOLERANCE, 2 HOURS W/ 1HR
Glucose, 1 hour: 106 mg/dL (ref 65–179)
Glucose, 2 hour: 97 mg/dL (ref 65–152)
Glucose, Fasting: 104 mg/dL — ABNORMAL HIGH (ref 65–91)

## 2021-03-05 LAB — RPR: RPR Ser Ql: NONREACTIVE

## 2021-03-05 LAB — HIV ANTIBODY (ROUTINE TESTING W REFLEX): HIV Screen 4th Generation wRfx: NONREACTIVE

## 2021-03-09 ENCOUNTER — Telehealth: Payer: Self-pay | Admitting: *Deleted

## 2021-03-09 DIAGNOSIS — O24419 Gestational diabetes mellitus in pregnancy, unspecified control: Secondary | ICD-10-CM

## 2021-03-09 MED ORDER — ACCU-CHEK FASTCLIX LANCET KIT: 1.0000 | PACK | Freq: Four times a day (QID) | 0 refills | Status: DC

## 2021-03-09 MED ORDER — ACCU-CHEK GUIDE VI STRP: 1.0000 | ORAL_STRIP | Freq: Four times a day (QID) | 12 refills | Status: DC

## 2021-03-09 MED ORDER — ACCU-CHEK GUIDE W/DEVICE KIT: 1.0000 | PACK | Freq: Four times a day (QID) | 0 refills | Status: DC

## 2021-03-09 NOTE — Telephone Encounter (Signed)
Left voice message for patient to return nurse call regarding test results.  Patient returned nurse called. Patient stated that she did fast before taking the glucose test. Advised she did fail the test. She will be contacted with an appointment for DM education and supplies will be sent to her pharmacy.   Clovis Pu, RN

## 2021-03-09 NOTE — Telephone Encounter (Signed)
-----   Message from Armando Reichert, CNM sent at 03/07/2021  9:54 AM EDT ----- Patient meets criteria for GDM, but I have a feeling she probably ate prior to the test based on these results. Can someone call her and see if she ate. If so she needs to repeat the testing fasting. If she did not eat then she meets criteria for GDM.

## 2021-03-18 ENCOUNTER — Ambulatory Visit (INDEPENDENT_AMBULATORY_CARE_PROVIDER_SITE_OTHER): Payer: Medicaid Other | Admitting: Certified Nurse Midwife

## 2021-03-18 ENCOUNTER — Encounter: Payer: Self-pay | Admitting: Registered"

## 2021-03-18 ENCOUNTER — Encounter: Payer: Medicaid Other | Attending: Advanced Practice Midwife | Admitting: Registered"

## 2021-03-18 ENCOUNTER — Other Ambulatory Visit: Payer: Self-pay

## 2021-03-18 VITALS — BP 110/72 | HR 90 | Temp 98.0°F | Wt 194.4 lb

## 2021-03-18 DIAGNOSIS — O24419 Gestational diabetes mellitus in pregnancy, unspecified control: Secondary | ICD-10-CM | POA: Diagnosis not present

## 2021-03-18 DIAGNOSIS — N949 Unspecified condition associated with female genital organs and menstrual cycle: Secondary | ICD-10-CM

## 2021-03-18 DIAGNOSIS — O24410 Gestational diabetes mellitus in pregnancy, diet controlled: Secondary | ICD-10-CM

## 2021-03-18 DIAGNOSIS — Z3A30 30 weeks gestation of pregnancy: Secondary | ICD-10-CM

## 2021-03-18 DIAGNOSIS — Z3483 Encounter for supervision of other normal pregnancy, third trimester: Secondary | ICD-10-CM

## 2021-03-18 HISTORY — DX: Gestational diabetes mellitus in pregnancy, unspecified control: O24.419

## 2021-03-18 MED ORDER — MAG-OXIDE 200 MG PO TABS: 400.0000 mg | ORAL_TABLET | Freq: Every day | ORAL | 3 refills | Status: DC

## 2021-03-18 NOTE — Patient Instructions (Signed)

## 2021-03-18 NOTE — Progress Notes (Signed)
Patient was seen on 03/18/21 for Gestational Diabetes self-management class at [facility]. The following learning objectives were met by the patient during this course:   States the definition of Gestational Diabetes  States why dietary management is important in controlling blood glucose  Describes the effects each nutrient has on blood glucose levels  Demonstrates ability to create a balanced meal plan  Demonstrates carbohydrate counting   States when to check blood glucose levels  Demonstrates proper blood glucose monitoring techniques  States the effect of stress and exercise on blood glucose levels  States the importance of limiting caffeine and abstaining from alcohol and smoking  Blood glucose monitor given: Accu-chek Guide Me Lot #[id number] Exp: 02/201/2023 CBG: 153 mg/dL  Patient instructed to monitor glucose levels: FBS: 60 - <95; 1 hour: <140; 2 hour: <120  Patient received handouts:  Nutrition Diabetes and Pregnancy, including carb counting list  Patient will be seen for follow-up as needed.

## 2021-03-18 NOTE — Progress Notes (Signed)
   PRENATAL VISIT NOTE  Subjective:  Olivia Mcfarland is a 30 y.o. G3P2002 at [redacted]w[redacted]d being seen today for ongoing prenatal care.  She is currently monitored for the following issues for this low-risk pregnancy and has Supervision of other normal pregnancy, antepartum on their problem list.  Patient reports low right sided pelvic pain, worst when walking.  Contractions: Not present. Vag. Bleeding: None.  Movement: Present. Denies leaking of fluid.   The following portions of the patient's history were reviewed and updated as appropriate: allergies, current medications, past family history, past medical history, past social history, past surgical history and problem list.   Objective:   Vitals:   03/18/21 0944  BP: 110/72  Pulse: 90  Temp: 98 F (36.7 C)  Weight: 194 lb 6.4 oz (88.2 kg)    Fetal Status: Fetal Heart Rate (bpm): 134 Fundal Height: 30 cm Movement: Present     General:  Alert, oriented and cooperative. Patient is in no acute distress.  Skin: Skin is warm and dry. No rash noted.   Cardiovascular: Normal heart rate noted  Respiratory: Normal respiratory effort, no problems with respiration noted  Abdomen: Soft, gravid, appropriate for gestational age.  Pain/Pressure: Present     Pelvic: Cervical exam deferred        Extremities: Normal range of motion.  Edema: None  Mental Status: Normal mood and affect. Normal behavior. Normal judgment and thought content.   Assessment and Plan:  Pregnancy: G3P2002 at [redacted]w[redacted]d 1. Encounter for supervision of other normal pregnancy in third trimester - Doing well, feeling vigorous fetal movement, thinks baby flipped to cephalic this week  2. [redacted] weeks gestation of pregnancy - Routine OB care  3. Round ligament pain - Demonstrated stretches and massage for relief of pain, included education in AVS - Magnesium Oxide (MAG-OXIDE) 200 MG TABS; Take 2 tablets (400 mg total) by mouth at bedtime. If that amount causes loose stools in the am,  switch to 200mg  daily at bedtime.  Dispense: 60 tablet; Refill: 3  4. Gestational diabetes mellitus in third trimester - Has GDM education appt this afternoon, encouraged her to attend and adhere to dietary changes - Will call in glucose level to RN this afternoon  Preterm labor symptoms and general obstetric precautions including but not limited to vaginal bleeding, contractions, leaking of fluid and fetal movement were reviewed in detail with the patient. Please refer to After Visit Summary for other counseling recommendations.   Return in about 2 weeks (around 04/01/2021) for IN-PERSON, LOB.  Future Appointments  Date Time Provider Department Center  03/18/2021  3:15 PM [facility] GDM CLASS [facility] NDM  03/31/2021  9:30 AM [facility] NURSE [facility]  03/31/2021  9:45 AM [facility] US5 [facility]  04/01/2021  1:50 PM 04/03/2021, CNM [facility] None   Gerrit Heck, CNM

## 2021-03-20 DIAGNOSIS — Z419 Encounter for procedure for purposes other than remedying health state, unspecified: Secondary | ICD-10-CM | POA: Diagnosis not present

## 2021-03-31 ENCOUNTER — Ambulatory Visit: Payer: Medicaid Other | Attending: Obstetrics

## 2021-03-31 ENCOUNTER — Other Ambulatory Visit: Payer: Self-pay | Admitting: *Deleted

## 2021-03-31 ENCOUNTER — Ambulatory Visit: Payer: Medicaid Other | Admitting: *Deleted

## 2021-03-31 ENCOUNTER — Other Ambulatory Visit: Payer: Self-pay

## 2021-03-31 ENCOUNTER — Encounter: Payer: Self-pay | Admitting: *Deleted

## 2021-03-31 DIAGNOSIS — Z3480 Encounter for supervision of other normal pregnancy, unspecified trimester: Secondary | ICD-10-CM

## 2021-03-31 DIAGNOSIS — Z3A32 32 weeks gestation of pregnancy: Secondary | ICD-10-CM | POA: Diagnosis not present

## 2021-03-31 DIAGNOSIS — O0933 Supervision of pregnancy with insufficient antenatal care, third trimester: Secondary | ICD-10-CM

## 2021-03-31 DIAGNOSIS — O24410 Gestational diabetes mellitus in pregnancy, diet controlled: Secondary | ICD-10-CM

## 2021-03-31 DIAGNOSIS — E669 Obesity, unspecified: Secondary | ICD-10-CM

## 2021-03-31 DIAGNOSIS — Z362 Encounter for other antenatal screening follow-up: Secondary | ICD-10-CM | POA: Diagnosis not present

## 2021-03-31 DIAGNOSIS — O99213 Obesity complicating pregnancy, third trimester: Secondary | ICD-10-CM

## 2021-04-01 ENCOUNTER — Ambulatory Visit (INDEPENDENT_AMBULATORY_CARE_PROVIDER_SITE_OTHER): Payer: Medicaid Other

## 2021-04-01 VITALS — BP 104/73 | HR 95 | Temp 97.5°F | Wt 198.4 lb

## 2021-04-01 DIAGNOSIS — Z3A32 32 weeks gestation of pregnancy: Secondary | ICD-10-CM

## 2021-04-01 DIAGNOSIS — O24419 Gestational diabetes mellitus in pregnancy, unspecified control: Secondary | ICD-10-CM | POA: Diagnosis not present

## 2021-04-01 DIAGNOSIS — Z3480 Encounter for supervision of other normal pregnancy, unspecified trimester: Secondary | ICD-10-CM | POA: Diagnosis not present

## 2021-04-01 LAB — GLUCOSE, POCT (MANUAL RESULT ENTRY): POC Glucose: 78 mg/dl (ref 70–99)

## 2021-04-01 NOTE — Patient Instructions (Signed)
Gestational Diabetes Mellitus, Self-Care Caring for yourself after a diagnosis of gestational diabetes mellitus means keeping your blood sugar under control. This can be done through nutrition, exercise, lifestyle changes, insulin and other medicines, and support from your health care team. Your health care provider will set individualized treatment goals for you. What are the risks? If left untreated, gestational diabetes can cause problems for mother and baby. For the mother Women who get gestational diabetes are more likely to:  Have labor induced and deliver early.  Have problems during labor and delivery, if the baby is larger than normal. This includes difficult labor and damage to the birth canal.  Have a cesarean delivery.  Have problems with blood pressure, including high blood pressure and preeclampsia.  Get it again if they become pregnant.  Develop type 2 diabetes in the future. For the baby Gestational diabetes that is not treated can cause the baby to have:  Low blood glucose (hypoglycemia).  Larger-than-normal body size (macrosomia).  Breathing problems. How to monitor blood glucose Check your blood glucose every day and as often as told by your health care provider. To do this: 1. Wash your hands with soap and water for at least 20 seconds. 2. Prick the side of your finger (not the tip) with the lancet. Use a different finger each time. 3. Gently rub the finger until a small drop of blood appears. 4. Follow instructions that come with your meter for inserting the test strip, applying blood to the strip, and getting the result. 5. Write down your result and any notes. Blood glucose goals are:  95 mg/dL (5.3 mmol/L) when fasting.  140 mg/dL (7.8 mmol/L) 1 hour after a meal.  120 mg/dL (6.7 mmol/L) 2 hours after a meal.   Follow these instructions at home: Medicines  Take over-the-counter and prescription medicines only as told by your health care  provider.  If your health care provider prescribed insulin or other diabetes medicines: ? Take them every day. ? Do not run out of insulin or other medicines. Plan ahead so you always have them available. Eating and drinking  Follow instructions from your health care provider about eating or drinking restrictions.  See a diet and nutrition expert (registered dietician) to help you create an eating plan that helps control your diabetes. The foods in this plan will include: ? Lean proteins. ? Complex carbohydrates. These are carbohydrates that contain fiber, have a lot of nutrients, and are digested slowly. They include dried beans, nuts, and whole grain breads, cereals, or pasta. ? Fresh fruits and vegetables. ? Low-fat dairy products. ? Healthy fats.  Eat healthy snacks between nutritious meals.  Drink enough fluid to keep your urine pale yellow.  Keep a record of the carbohydrates that you eat. To do this: ? Read food labels. ? Learn the standard serving sizes of foods.  Make a sick day plan with your health care provider before you get sick. Follow this plan whenever you cannot eat or drink as usual.   Activity  Do exercises as told by your health care provider.  Do 30 or more minutes of physical activity a day, or as much physical activity as your health care provider recommends. It may help to control blood glucose levels after a meal if you: ? Do 10 minutes of exercise after each meal. ? Start this exercise 30 minutes after the meal.  If you start a new exercise or activity, work with your health care provider to adjust your   insulin, other medicines, or food as needed. Lifestyle  Do not drink alcohol.  Do not use any products that contain nicotine or tobacco, such as cigarettes, e-cigarettes, and chewing tobacco. If you need help quitting, ask your health care provider.  Learn to manage stress. If you need help with this, ask your health care provider. Body care  Keep  your vaccines up to date.  Practice good oral hygiene. To do this: ? Clean your teeth and gums two times a day. ? Floss one or more times a day. ? Visit your dentist one or more times every 6 months.  Stay at a healthy weight while you are pregnant. Your expected weight gain depends on your BMI (body mass index) before pregnancy. General instructions  Talk with your health care provider about the risk for high blood pressure during pregnancy (preeclampsia and eclampsia).  Share your diabetes management plan with people in your workplace, school, and household.  Check your urine for ketones when sick and as told by your health care provider. Ketones are made by the liver when a lack of glucose forces the body to use fat for energy.  Carry a medical alert card or wear medical alert jewelry that says you have gestational diabetes.  Keep all follow-up visits. This is important. Get care after delivery  Have your blood glucose level checked with an oral glucose tolerance test (OGTT) 4-12 weeks after delivery.  Get screened for diabetes at least every 3 years, or as often as told by your health care provider. Where to find more information  American Diabetes Association (ADA): diabetes.org  Association of Diabetes Care & Education Specialists (ADCES): diabeteseducator.org  Centers for Disease Control and Prevention (CDC): cdc.gov  American Pregnancy Association: americanpregnancy.org  [place] Department of Agriculture MyPlate: myplate.gov Contact a health care provider if:  Your blood glucose is above your target for two tests in a row.  You have a fever.  You have been sick for 2 days or more and are not getting better.  You have either of these problems for more than 6 hours: ? Vomiting every time you eat or drink. ? Diarrhea. Get help right away if you:  Become confused or cannot think clearly.  Have trouble breathing.  Have moderate or high ketones in your  urine.  Feel your baby is not moving as usual.  Develop unusual discharge or bleeding from your vagina.  Start having early (premature) contractions. Contractions may feel like a tightening in your lower abdomen  Have a severe headache. These symptoms may represent a serious problem that is an emergency. Do not wait to see if the symptoms will go away. Get medical help right away. Call your local emergency services (911 in the [place] Do not drive yourself to the hospital. Summary  Check your blood glucose every day during your pregnancy. Do this as often as told by your health care provider.  Take insulin or other diabetes medicines every day, if your health care provider prescribed them.  Have your blood glucose level checked 4-12 weeks after delivery.  Keep all follow-up visits. This is important. This information is not intended to replace advice given to you by your health care provider. Make sure you discuss any questions you have with your health care provider. Document Revised: 05/12/2020 Document Reviewed: 05/12/2020 Elsevier Patient Education  2021 Elsevier Inc.  

## 2021-04-01 NOTE — Progress Notes (Signed)
   HIGH-RISK PREGNANCY OFFICE VISIT  Patient name: Olivia Mcfarland MRN [id number]  Date of birth: 10-Dec-1991 Chief Complaint:   Routine Prenatal Visit  Subjective:   Olivia Mcfarland is a 30 y.o. G46P2002 female at [redacted]w[redacted]d with an Estimated Date of Delivery: 05/23/21 being seen today for ongoing management of a high-risk pregnancy aeb has Supervision of other normal pregnancy, antepartum and Gestational diabetes mellitus (GDM), antepartum on their problem list.  Patient presents today with complaint of fatigue. She states she works 6 hours per day and is doing a lot of walking.  She reports she has not been taking her CBGs regularly, but that "no numbers are over what they suppose to be."  Patient states she has pelvic pressure, but has not picked up her maternity belt. Patient endorses fetal movement and denies vaginal concerns including abnormal discharge, leaking of fluid, and bleeding. Patient denies abdominal cramping or contractions.  Contractions: Not present. Vag. Bleeding: None.  Movement: Present.  Reviewed past medical,surgical, social, obstetrical and family history as well as problem list, medications and allergies.  Objective   Vitals:   04/01/21 1349  BP: 104/73  Pulse: 95  Temp: (!) 97.5 F (36.4 C)  Weight: 198 lb 6.4 oz (90 kg)  Body mass index is 36.29 kg/m.  Total Weight Gain:12 lb 6.4 oz (5.625 kg)         Physical Examination:   General appearance: Well appearing, and in no distress  Mental status: Alert, oriented to person, place, and time  Skin: Warm & dry  Cardiovascular: Normal heart rate noted  Respiratory: Normal respiratory effort, no distress  Abdomen: Soft, gravid, nontender, AGA with  Fundal Height: 31 cm  Pelvic: Cervical exam deferred           Extremities: Edema: Trace  Fetal Status: Fetal Heart Rate (bpm): 141  Movement: Present   Results for orders placed or performed in visit on 04/01/21 (from the past 24 hour(s))  POCT Glucose (CBG)    Collection Time: 04/01/21  2:00 PM  Result Value Ref Range   POC Glucose 78 70 - 99 mg/dl    Assessment & Plan:  High-risk pregnancy of a 31 y.o., G3P2002 at [redacted]w[redacted]d with an Estimated Date of Delivery: 05/23/21   1. Supervision of other normal pregnancy, antepartum -Anticipatory guidance for upcoming appts. -Patient to next appt in 2 weeks for a virtual visit.  2. Gestational diabetes mellitus (GDM) affecting pregnancy -Not taking BS regularly. -Discussed need for regular BS monitoring. -Instructed to send most recent BS via mychart. -Random CBG 78  3. [redacted] weeks gestation of pregnancy -Doing well. -Discussed Growth [place]. -Fetus in 42nd%ile   Meds: No orders of the defined types were placed in this encounter.  Labs/procedures today:   Lab Orders     POCT Glucose (CBG)   Reviewed: Preterm labor symptoms and general obstetric precautions including but not limited to vaginal bleeding, contractions, leaking of fluid and fetal movement were reviewed in detail with the patient.  All questions were answered.  Follow-up: Return in about 2 weeks (around 04/15/2021) for Virtual LR-ROB.  Orders Placed This Encounter  Procedures  . POCT Glucose (CBG)   Cherre Robins MSN, CNM 04/01/2021

## 2021-04-16 ENCOUNTER — Telehealth (INDEPENDENT_AMBULATORY_CARE_PROVIDER_SITE_OTHER): Payer: Medicaid Other | Admitting: Obstetrics and Gynecology

## 2021-04-16 ENCOUNTER — Encounter: Payer: Self-pay | Admitting: Obstetrics and Gynecology

## 2021-04-16 VITALS — BP 127/77 | HR 97 | Wt 200.2 lb

## 2021-04-16 DIAGNOSIS — Z3A34 34 weeks gestation of pregnancy: Secondary | ICD-10-CM | POA: Diagnosis not present

## 2021-04-16 DIAGNOSIS — O24419 Gestational diabetes mellitus in pregnancy, unspecified control: Secondary | ICD-10-CM

## 2021-04-16 DIAGNOSIS — Z3480 Encounter for supervision of other normal pregnancy, unspecified trimester: Secondary | ICD-10-CM

## 2021-04-16 NOTE — Progress Notes (Addendum)
MY CHART VIDEO VIRTUAL OBSTETRICS VISIT ENCOUNTER NOTE  I connected with Olivia Mcfarland on 04/16/21 at  2:50 PM EDT by My Chart video at home and verified that I am speaking with the correct person using two identifiers. Provider located at [employer] for [employer] at [place].   I discussed the limitations, risks, security and privacy concerns of performing an evaluation and management service by My Chart video and the availability of in person appointments. I also discussed with the patient that there may be a patient responsible charge related to this service. The patient expressed understanding and agreed to proceed.  Subjective:  Olivia Mcfarland is a 30 y.o. G3P2002 at [redacted]w[redacted]d being followed for ongoing prenatal care.  She is currently monitored for the following issues for this low-risk pregnancy and has Supervision of other normal pregnancy, antepartum and Gestational diabetes mellitus (GDM), antepartum on their problem list.  Patient reports no complaints. Reports fetal movement. Denies any contractions, bleeding or leaking of fluid.   The following portions of the patient's history were reviewed and updated as appropriate: allergies, current medications, past family history, past medical history, past social history, past surgical history and problem list.   Objective:   General:   Alert, oriented and cooperative.   Mental Status:  Normal mood and affect perceived. Normal judgment and thought content.  Rest of physical exam deferred due to type of encounter  BP 127/77   Pulse 97   Wt 200 lb 3.2 oz (90.8 kg)   LMP 08/16/2020   BMI 36.62 kg/m  **Done by patient's own at home BP cuff and scale  Assessment and Plan:  Pregnancy: G3P2002 at [redacted]w[redacted]d  1. Supervision of other normal pregnancy, antepartum - Anticipatory guidance for GBS screening at 36 wks. Explained the test is important to be done at this time in pregnancy to ensure adequate treatment at the time of  delivery. Explained that a positive result does not mean any harm to her, but can be harmful to the baby. Meaning that if baby is exposed to the bacteria for too long without antibiotics, the bay has the potential to develop pneumonia, septicemia, or spinal meningitis and could end up in the NICU. Also, explained that a cervical exam may be performed at the time of testing to get a baseline cervical check and make sure there is no preterm cervical dilation.  2. Gestational diabetes mellitus (GDM) affecting pregnancy - Review of Blood Sugar Levels: FBS range = 62-74 mg/dL  2 hr PP range = 371-062 - Patient will send a photo of BS log through MyChart  3. [redacted] weeks gestation of pregnancy    Preterm labor symptoms and general obstetric precautions including but not limited to vaginal bleeding, contractions, leaking of fluid and fetal movement were reviewed in detail with the patient.  I discussed the assessment and treatment plan with the patient. The patient was provided an opportunity to ask questions and all were answered. The patient agreed with the plan and demonstrated an understanding of the instructions. The patient was advised to call back or seek an in-person office evaluation/go to [facility]U at [facility] for any urgent or concerning symptoms. Please refer to After Visit Summary for other counseling recommendations.   I provided 5 minutes of non-face-to-face time during this encounter. There was 5 minutes of chart review time spent prior to this encounter. Total time spent = 10 minutes.  Return in about 2 weeks (around 04/30/2021) for Return OB w/GBS.  Future  Appointments  Date Time Provider Department Center  04/28/2021  1:00 PM [facility] NURSE [facility]  04/28/2021  1:15 PM [facility] US2 [facility]  04/30/2021  2:50 PM Raelyn Mora, CNM [facility] None  05/14/2021  2:30 PM Raelyn Mora, CNM [facility] None  05/21/2021  2:10 PM Raelyn Mora, CNM [facility] None    Raelyn Mora, CNM Center for [employer], [facility]

## 2021-04-16 NOTE — Patient Instructions (Signed)

## 2021-04-19 DIAGNOSIS — Z419 Encounter for procedure for purposes other than remedying health state, unspecified: Secondary | ICD-10-CM | POA: Diagnosis not present

## 2021-04-28 ENCOUNTER — Ambulatory Visit: Payer: Medicaid Other | Admitting: *Deleted

## 2021-04-28 ENCOUNTER — Ambulatory Visit: Payer: Medicaid Other | Attending: Maternal & Fetal Medicine

## 2021-04-28 ENCOUNTER — Other Ambulatory Visit: Payer: Self-pay

## 2021-04-28 ENCOUNTER — Encounter: Payer: Self-pay | Admitting: *Deleted

## 2021-04-28 DIAGNOSIS — Z362 Encounter for other antenatal screening follow-up: Secondary | ICD-10-CM

## 2021-04-28 DIAGNOSIS — E669 Obesity, unspecified: Secondary | ICD-10-CM | POA: Diagnosis not present

## 2021-04-28 DIAGNOSIS — O24410 Gestational diabetes mellitus in pregnancy, diet controlled: Secondary | ICD-10-CM | POA: Diagnosis not present

## 2021-04-28 DIAGNOSIS — O99213 Obesity complicating pregnancy, third trimester: Secondary | ICD-10-CM

## 2021-04-28 DIAGNOSIS — Z3480 Encounter for supervision of other normal pregnancy, unspecified trimester: Secondary | ICD-10-CM | POA: Insufficient documentation

## 2021-04-28 DIAGNOSIS — O0933 Supervision of pregnancy with insufficient antenatal care, third trimester: Secondary | ICD-10-CM | POA: Diagnosis not present

## 2021-04-28 DIAGNOSIS — Z3A36 36 weeks gestation of pregnancy: Secondary | ICD-10-CM | POA: Insufficient documentation

## 2021-04-30 ENCOUNTER — Ambulatory Visit (INDEPENDENT_AMBULATORY_CARE_PROVIDER_SITE_OTHER): Payer: Medicaid Other | Admitting: Obstetrics and Gynecology

## 2021-04-30 ENCOUNTER — Other Ambulatory Visit (HOSPITAL_COMMUNITY)
Admission: RE | Admit: 2021-04-30 | Discharge: 2021-04-30 | Disposition: A | Discharge status: Home / Self Care | Payer: Medicaid Other | Source: Ambulatory Visit | Attending: Obstetrics and Gynecology | Admitting: Obstetrics and Gynecology

## 2021-04-30 ENCOUNTER — Other Ambulatory Visit: Payer: Self-pay

## 2021-04-30 VITALS — BP 111/75 | HR 87 | Temp 98.2°F | Wt 203.2 lb

## 2021-04-30 DIAGNOSIS — Z3A36 36 weeks gestation of pregnancy: Secondary | ICD-10-CM

## 2021-04-30 DIAGNOSIS — Z3480 Encounter for supervision of other normal pregnancy, unspecified trimester: Secondary | ICD-10-CM

## 2021-04-30 DIAGNOSIS — O24419 Gestational diabetes mellitus in pregnancy, unspecified control: Secondary | ICD-10-CM

## 2021-05-01 LAB — CERVICOVAGINAL ANCILLARY ONLY
Bacterial Vaginitis (gardnerella): NEGATIVE
Candida Glabrata: NEGATIVE
Candida Vaginitis: POSITIVE — AB
Chlamydia: NEGATIVE
Comment: NEGATIVE
Comment: NEGATIVE
Comment: NEGATIVE
Comment: NEGATIVE
Comment: NEGATIVE
Comment: NORMAL
Neisseria Gonorrhea: NEGATIVE
Trichomonas: NEGATIVE

## 2021-05-02 ENCOUNTER — Encounter: Payer: Self-pay | Admitting: Obstetrics and Gynecology

## 2021-05-02 ENCOUNTER — Other Ambulatory Visit: Payer: Self-pay | Admitting: Obstetrics and Gynecology

## 2021-05-02 DIAGNOSIS — B373 Candidiasis of vulva and vagina: Secondary | ICD-10-CM

## 2021-05-02 DIAGNOSIS — B3731 Acute candidiasis of vulva and vagina: Secondary | ICD-10-CM

## 2021-05-02 MED ORDER — TERCONAZOLE 0.4 % VA CREA: 1.0000 | TOPICAL_CREAM | Freq: Every day | VAGINAL | 0 refills | Status: AC

## 2021-05-02 NOTE — Progress Notes (Addendum)
   LOW-RISK PREGNANCY OFFICE VISIT Patient name: Olivia Mcfarland MRN [id number]  Date of birth: 02/23/1991 Chief Complaint:   Routine Prenatal Visit  History of Present Illness:   Olivia Mcfarland is a 30 y.o. G17P2002 female at [redacted]w[redacted]d with an Estimated Date of Delivery: 05/23/21 being seen today for ongoing management of a low-risk pregnancy.  Today she reports pressure. Contractions: Not present. Vag. Bleeding: None.  Movement: Present. denies leaking of fluid. Review of Systems:   Pertinent items are noted in HPI Denies abnormal vaginal discharge w/ itching/odor/irritation, headaches, visual changes, shortness of breath, chest pain, abdominal pain, severe nausea/vomiting, or problems with urination or bowel movements unless otherwise stated above. Pertinent History Reviewed:  Reviewed past medical,surgical, social, obstetrical and family history.  Reviewed problem list, medications and allergies. Physical Assessment:   Vitals:   04/30/21 1501  BP: 111/75  Pulse: 87  Temp: 98.2 F (36.8 C)  Weight: 203 lb 3.2 oz (92.2 kg)  Body mass index is 37.17 kg/m.        Physical Examination:   General appearance: Well appearing, and in no distress  Mental status: Alert, oriented to person, place, and time  Skin: Warm & dry  Cardiovascular: Normal heart rate noted  Respiratory: Normal respiratory effort, no distress  Abdomen: Soft, gravid, nontender  Pelvic: Cervical exam performed  Dilation: Closed Effacement (%): Thick Station: Ballotable  Extremities: Edema: None  Fetal Status: Fetal Heart Rate (bpm): 146 Fundal Height: 39 cm Movement: Present Presentation: Vertex  No results found for this or any previous visit (from the past 24 hour(s)).  Assessment & Plan:  1) Low-risk pregnancy G3P2002 at [redacted]w[redacted]d with an Estimated Date of Delivery: 05/23/21   2) Supervision of other normal pregnancy, antepartum  - Culture, beta strep (group b only),  - Cervicovaginal ancillary only( [facility])  3) Gestational diabetes mellitus (GDM) affecting pregnancy - Review of Blood Sugar Levels: FBS & 2 hr range = 60-145 mg/dL  - Patient admits to not eating what she is supposed to "some of the time" - Advised to select lean proteins and green leafy vegetables, avoiding carbs  4) [redacted] weeks gestation of pregnancy    Meds: No orders of the defined types were placed in this encounter.  Labs/procedures today: GBS and GC/CT  Plan:  Continue routine obstetrical care   Reviewed: Preterm labor symptoms and general obstetric precautions including but not limited to vaginal bleeding, contractions, leaking of fluid and fetal movement were reviewed in detail with the patient.  All questions were answered. Has home bp cuff. Check bp weekly, let us know if >140/90.   Follow-up: Return in about 1 week (around 05/07/2021) for Return OB visit.  Orders Placed This Encounter  Procedures  . Culture, beta strep (group b only)   Raelyn Mora MSN, CNM 04/30/2021 3:00 PM

## 2021-05-04 LAB — CULTURE, BETA STREP (GROUP B ONLY): Strep Gp B Culture: NEGATIVE

## 2021-05-14 ENCOUNTER — Telehealth: Payer: Medicaid Other | Admitting: Obstetrics and Gynecology

## 2021-05-14 ENCOUNTER — Telehealth: Payer: Self-pay | Admitting: *Deleted

## 2021-05-14 NOTE — Telephone Encounter (Signed)
Left voice message for patient to return nurse call for start of Mychart visit.   Clovis Pu, RN

## 2021-05-20 DIAGNOSIS — Z419 Encounter for procedure for purposes other than remedying health state, unspecified: Secondary | ICD-10-CM | POA: Diagnosis not present

## 2021-05-21 ENCOUNTER — Inpatient Hospital Stay (HOSPITAL_COMMUNITY)
Admission: AD | Admit: 2021-05-21 | Discharge: 2021-05-23 | DRG: 807 | Disposition: A | Discharge status: Home / Self Care | Payer: Medicaid Other | Attending: Obstetrics and Gynecology | Admitting: Obstetrics and Gynecology

## 2021-05-21 ENCOUNTER — Ambulatory Visit (INDEPENDENT_AMBULATORY_CARE_PROVIDER_SITE_OTHER): Payer: Medicaid Other | Admitting: Obstetrics and Gynecology

## 2021-05-21 ENCOUNTER — Other Ambulatory Visit: Payer: Self-pay

## 2021-05-21 ENCOUNTER — Encounter (HOSPITAL_COMMUNITY): Payer: Self-pay | Admitting: Obstetrics & Gynecology

## 2021-05-21 VITALS — BP 116/74 | HR 90 | Temp 98.5°F | Wt 204.4 lb

## 2021-05-21 DIAGNOSIS — Z3480 Encounter for supervision of other normal pregnancy, unspecified trimester: Secondary | ICD-10-CM

## 2021-05-21 DIAGNOSIS — Z3A39 39 weeks gestation of pregnancy: Secondary | ICD-10-CM

## 2021-05-21 DIAGNOSIS — O24420 Gestational diabetes mellitus in childbirth, diet controlled: Principal | ICD-10-CM | POA: Diagnosis present

## 2021-05-21 DIAGNOSIS — Z20822 Contact with and (suspected) exposure to covid-19: Secondary | ICD-10-CM | POA: Diagnosis present

## 2021-05-21 NOTE — Progress Notes (Signed)
   LOW-RISK PREGNANCY OFFICE VISIT Patient name: Olivia Mcfarland MRN [id number]  Date of birth: Nov 01, 1991 Chief Complaint:   Routine Prenatal Visit  History of Present Illness:   Olivia Mcfarland is a 30 y.o. G63P2002 female at [redacted]w[redacted]d with an Estimated Date of Delivery: 05/23/21 being seen today for ongoing management of a low-risk pregnancy.  Today she reports occasional contractions. Contractions: Irregular. Vag. Bleeding: None.  Movement: Present. denies leaking of fluid. Review of Systems:   Pertinent items are noted in HPI Denies abnormal vaginal discharge w/ itching/odor/irritation, headaches, visual changes, shortness of breath, chest pain, abdominal pain, severe nausea/vomiting, or problems with urination or bowel movements unless otherwise stated above. Pertinent History Reviewed:  Reviewed past medical,surgical, social, obstetrical and family history.  Reviewed problem list, medications and allergies. Physical Assessment:   Vitals:   05/21/21 1417  BP: 116/74  Pulse: 90  Temp: 98.5 F (36.9 C)  Weight: 204 lb 6.4 oz (92.7 kg)  Body mass index is 37.39 kg/m.        Physical Examination:   General appearance: Well appearing, and in no distress  Mental status: Alert, oriented to person, place, and time  Skin: Warm & dry  Cardiovascular: Normal heart rate noted  Respiratory: Normal respiratory effort, no distress  Abdomen: Soft, gravid, nontender  Pelvic: Cervical exam performed  Dilation: 3 Effacement (%): 50 Station: -3  Discussed membrane sweeping today. Reviewed Cochrane Review data on membrane sweeping at 39 wks and then at EDD. Reviewed risk of cramping, contractions, bleeding and ROM. Answered patient questions and she agreed to proceed with procedure.  Extremities: Edema: None  Fetal Status: Fetal Heart Rate (bpm): 156 Fundal Height: 41 cm Movement: Present Presentation: Vertex  No results found for this or any previous visit (from the past 24 hour(s)).  Assessment  & Plan:  1) Low-risk pregnancy G3P2002 at [redacted]w[redacted]d with an Estimated Date of Delivery: 05/23/21   2) Supervision of other normal pregnancy, antepartum - Advised to have sex, walk, pack bags and await labor  3) [redacted] weeks gestation of pregnancy    Meds: No orders of the defined types were placed in this encounter.  Labs/procedures today: cervical exam  Plan:  Continue routine obstetrical care   Reviewed: Term labor symptoms and general obstetric precautions including but not limited to vaginal bleeding, contractions, leaking of fluid and fetal movement were reviewed in detail with the patient.  All questions were answered. Has home bp cuff. Check bp weekly, let us know if >140/90.   Follow-up: No follow-ups on file.  No orders of the defined types were placed in this encounter.  Raelyn Mora MSN, CNM 05/21/2021 2:36 PM

## 2021-05-22 ENCOUNTER — Inpatient Hospital Stay (HOSPITAL_COMMUNITY): Payer: Medicaid Other | Admitting: Anesthesiology

## 2021-05-22 ENCOUNTER — Encounter (HOSPITAL_COMMUNITY): Payer: Self-pay | Admitting: Obstetrics & Gynecology

## 2021-05-22 DIAGNOSIS — O24420 Gestational diabetes mellitus in childbirth, diet controlled: Secondary | ICD-10-CM | POA: Diagnosis not present

## 2021-05-22 DIAGNOSIS — Z20822 Contact with and (suspected) exposure to covid-19: Secondary | ICD-10-CM | POA: Diagnosis not present

## 2021-05-22 DIAGNOSIS — O24429 Gestational diabetes mellitus in childbirth, unspecified control: Secondary | ICD-10-CM | POA: Diagnosis not present

## 2021-05-22 DIAGNOSIS — Z3A39 39 weeks gestation of pregnancy: Secondary | ICD-10-CM | POA: Diagnosis not present

## 2021-05-22 DIAGNOSIS — O4202 Full-term premature rupture of membranes, onset of labor within 24 hours of rupture: Secondary | ICD-10-CM | POA: Diagnosis not present

## 2021-05-22 LAB — CBC
HCT: 36.6 % (ref 36.0–46.0)
Hemoglobin: 11.6 g/dL — ABNORMAL LOW (ref 12.0–15.0)
MCH: 28.3 pg (ref 26.0–34.0)
MCHC: 31.7 g/dL (ref 30.0–36.0)
MCV: 89.3 fL (ref 80.0–100.0)
Platelets: 166 10*3/uL (ref 150–400)
RBC: 4.1 MIL/uL (ref 3.87–5.11)
RDW: 14.2 % (ref 11.5–15.5)
WBC: 13.5 10*3/uL — ABNORMAL HIGH (ref 4.0–10.5)
nRBC: 0 % (ref 0.0–0.2)

## 2021-05-22 LAB — TYPE AND SCREEN
ABO/RH(D): A POS
Antibody Screen: NEGATIVE

## 2021-05-22 LAB — GLUCOSE, CAPILLARY: Glucose-Capillary: 86 mg/dL (ref 70–99)

## 2021-05-22 LAB — RESP PANEL BY RT-PCR (FLU A&B, COVID) ARPGX2
Influenza A by PCR: NEGATIVE
Influenza B by PCR: NEGATIVE
SARS Coronavirus 2 by RT PCR: NEGATIVE

## 2021-05-22 LAB — RPR: RPR Ser Ql: NONREACTIVE

## 2021-05-22 MED ORDER — EPHEDRINE 5 MG/ML INJ: 10.0000 mg | INTRAVENOUS | Status: DC | PRN

## 2021-05-22 MED ORDER — ONDANSETRON HCL 4 MG/2ML IJ SOLN: 4.0000 mg | Freq: Four times a day (QID) | INTRAMUSCULAR | Status: DC | PRN

## 2021-05-22 MED ORDER — LIDOCAINE HCL (PF) 1 % IJ SOLN: 30.0000 mL | INTRAMUSCULAR | Status: DC | PRN

## 2021-05-22 MED ORDER — ONDANSETRON HCL 4 MG PO TABS: 4.0000 mg | ORAL_TABLET | ORAL | Status: DC | PRN

## 2021-05-22 MED ORDER — OXYTOCIN-SODIUM CHLORIDE 30-0.9 UT/500ML-% IV SOLN
1.0000 m[IU]/min | INTRAVENOUS | Status: DC
  Administered 2021-05-22: 2 m[IU]/min via INTRAVENOUS
  Filled 2021-05-22: qty 500

## 2021-05-22 MED ORDER — SENNOSIDES-DOCUSATE SODIUM 8.6-50 MG PO TABS
2.0000 | ORAL_TABLET | Freq: Every day | ORAL | Status: DC
  Administered 2021-05-23: 2 via ORAL
  Filled 2021-05-22: qty 2

## 2021-05-22 MED ORDER — DIBUCAINE (PERIANAL) 1 % EX OINT: 1.0000 "application " | TOPICAL_OINTMENT | CUTANEOUS | Status: DC | PRN

## 2021-05-22 MED ORDER — FENTANYL CITRATE (PF) 100 MCG/2ML IJ SOLN
50.0000 ug | INTRAMUSCULAR | Status: DC | PRN
  Administered 2021-05-22: 50 ug via INTRAVENOUS
  Filled 2021-05-22: qty 2

## 2021-05-22 MED ORDER — OXYTOCIN-SODIUM CHLORIDE 30-0.9 UT/500ML-% IV SOLN: 2.5000 [IU]/h | INTRAVENOUS | Status: DC

## 2021-05-22 MED ORDER — ONDANSETRON HCL 4 MG/2ML IJ SOLN: 4.0000 mg | INTRAMUSCULAR | Status: DC | PRN

## 2021-05-22 MED ORDER — COCONUT OIL OIL: 1.0000 "application " | TOPICAL_OIL | Status: DC | PRN

## 2021-05-22 MED ORDER — ONDANSETRON HCL 4 MG/2ML IJ SOLN
4.0000 mg | Freq: Once | INTRAMUSCULAR | Status: AC
  Administered 2021-05-22: 4 mg via INTRAVENOUS
  Filled 2021-05-22: qty 2

## 2021-05-22 MED ORDER — PHENYLEPHRINE 40 MCG/ML (10ML) SYRINGE FOR IV PUSH (FOR BLOOD PRESSURE SUPPORT): 80.0000 ug | PREFILLED_SYRINGE | INTRAVENOUS | Status: DC | PRN

## 2021-05-22 MED ORDER — ACETAMINOPHEN 325 MG PO TABS: 650.0000 mg | ORAL_TABLET | ORAL | Status: DC | PRN

## 2021-05-22 MED ORDER — LACTATED RINGERS IV SOLN: 500.0000 mL | INTRAVENOUS | Status: DC | PRN

## 2021-05-22 MED ORDER — DIPHENHYDRAMINE HCL 25 MG PO CAPS: 25.0000 mg | ORAL_CAPSULE | Freq: Four times a day (QID) | ORAL | Status: DC | PRN

## 2021-05-22 MED ORDER — TETANUS-DIPHTH-ACELL PERTUSSIS 5-2.5-18.5 LF-MCG/0.5 IM SUSY: 0.5000 mL | PREFILLED_SYRINGE | Freq: Once | INTRAMUSCULAR | Status: DC

## 2021-05-22 MED ORDER — WITCH HAZEL-GLYCERIN EX PADS: 1.0000 "application " | MEDICATED_PAD | CUTANEOUS | Status: DC | PRN

## 2021-05-22 MED ORDER — FENTANYL-BUPIVACAINE-NACL 0.5-0.125-0.9 MG/250ML-% EP SOLN
EPIDURAL | Status: AC
  Filled 2021-05-22: qty 250

## 2021-05-22 MED ORDER — DIPHENHYDRAMINE HCL 50 MG/ML IJ SOLN: 12.5000 mg | INTRAMUSCULAR | Status: DC | PRN

## 2021-05-22 MED ORDER — ZOLPIDEM TARTRATE 5 MG PO TABS: 5.0000 mg | ORAL_TABLET | Freq: Every evening | ORAL | Status: DC | PRN

## 2021-05-22 MED ORDER — PRENATAL MULTIVITAMIN CH
1.0000 | ORAL_TABLET | Freq: Every day | ORAL | Status: DC
  Administered 2021-05-23: 1 via ORAL
  Filled 2021-05-22: qty 1

## 2021-05-22 MED ORDER — IBUPROFEN 600 MG PO TABS
600.0000 mg | ORAL_TABLET | Freq: Four times a day (QID) | ORAL | Status: DC
  Administered 2021-05-22 – 2021-05-23 (×4): 600 mg via ORAL
  Filled 2021-05-22 (×5): qty 1

## 2021-05-22 MED ORDER — LACTATED RINGERS IV SOLN: 500.0000 mL | Freq: Once | INTRAVENOUS | Status: DC

## 2021-05-22 MED ORDER — LIDOCAINE HCL (PF) 1 % IJ SOLN
INTRAMUSCULAR | Status: DC | PRN
  Administered 2021-05-22 (×2): 4 mL via EPIDURAL

## 2021-05-22 MED ORDER — TERBUTALINE SULFATE 1 MG/ML IJ SOLN: 0.2500 mg | Freq: Once | INTRAMUSCULAR | Status: DC | PRN

## 2021-05-22 MED ORDER — SOD CITRATE-CITRIC ACID 500-334 MG/5ML PO SOLN: 30.0000 mL | ORAL | Status: DC | PRN

## 2021-05-22 MED ORDER — SIMETHICONE 80 MG PO CHEW: 80.0000 mg | CHEWABLE_TABLET | ORAL | Status: DC | PRN

## 2021-05-22 MED ORDER — BENZOCAINE-MENTHOL 20-0.5 % EX AERO: 1.0000 "application " | INHALATION_SPRAY | CUTANEOUS | Status: DC | PRN

## 2021-05-22 MED ORDER — OXYTOCIN BOLUS FROM INFUSION
333.0000 mL | Freq: Once | INTRAVENOUS | Status: AC
  Administered 2021-05-22: 333 mL via INTRAVENOUS

## 2021-05-22 MED ORDER — LACTATED RINGERS IV SOLN: INTRAVENOUS | Status: DC

## 2021-05-22 MED ORDER — FENTANYL-BUPIVACAINE-NACL 0.5-0.125-0.9 MG/250ML-% EP SOLN
12.0000 mL/h | EPIDURAL | Status: DC | PRN
  Administered 2021-05-22: 12 mL/h via EPIDURAL

## 2021-05-22 NOTE — H&P (Addendum)
OBSTETRIC ADMISSION HISTORY AND PHYSICAL  Olivia Mcfarland is a 30 y.o. female G3P2002 with IUP at 74w6dby 13wk [place] for SOL. Pregnancy has been complicated by moderately controlled GDMA1. She reports +FMs, No LOF, no VB, no blurry vision, headaches or peripheral edema, and RUQ pain. She was seen in the office yesterday and reported occasional, irregular contractions. Cervical check in the office showed she was 3cm/50%/-3. She presented to the MAU due to painful contractions every 5-10 minutes. In the MAU she progressed from 3cm to 5.5 cm and contractions became more and more painful. She currently has an epidural and says she is still feeling painful contractions, but they are better than when she was in MAU.  She plans on formula and breastfeeding. She requests depo for birth control. [facility] Dating: By 13wk [place]--->  Estimated Date of Delivery: 05/23/21  Sono:    '@[redacted]w[redacted]d' , CWD, normal anatomy, cephalic presentation, anterior placent, 2695g, 28% EFW   Prenatal History/Complications:  - GDMA1  Past Medical History: Past Medical History:  Diagnosis Date  . Medical history non-contributory     Past Surgical History: Past Surgical History:  Procedure Laterality Date  . NO PAST SURGERIES      Obstetrical History: OB History    Gravida  3   Para  2   Term  2   Preterm      AB      Living  2     SAB      IAB      Ectopic      Multiple      Live Births  2           Social History Social History   Socioeconomic History  . Marital status: Significant Other    Spouse name: Not on file  . Number of children: 2  . Years of education: Not on file  . Highest education level: Not on file  Occupational History  . Occupation: Unemployed  Tobacco Use  . Smoking status: Never Smoker  . Smokeless tobacco: Never Used  Vaping Use  . Vaping Use: Never used  Substance and Sexual Activity  . Alcohol use: No  . Drug use: Yes     Types: Marijuana    Comment: Last time 1 week ago  . Sexual activity: Yes    Birth control/protection: None  Other Topics Concern  . Not on file  Social History Narrative  . Not on file   Social Determinants of Health   Financial Resource Strain: Low Risk   . Difficulty of Paying Living Expenses: Not hard at all  Food Insecurity: No Food Insecurity  . Worried About RCharity fundraiserin the Last Year: Never true  . Ran Out of Food in the Last Year: Never true  Transportation Needs: No Transportation Needs  . Lack of Transportation (Medical): No  . Lack of Transportation (Non-Medical): No  Physical Activity: Unknown  . Days of Exercise per Week: 2 days  . Minutes of Exercise per Session: Not on file  Stress: No Stress Concern Present  . Feeling of Stress : Not at all  Social Connections: Not on file    Family History: Family History  Problem Relation Age of Onset  . Ovarian cancer Mother     Allergies: No Known Allergies  Medications Prior to Admission  Medication Sig Dispense Refill Last Dose  . Prenatal Vit-Fe Fumarate-FA (PRENATAL MULTIVITAMIN) TABS tablet Take  1 tablet by mouth daily at 12 noon.   Past Week at Unknown time  . Blood Glucose Monitoring Suppl (ACCU-CHEK GUIDE) w/Device KIT 1 kit by Does not apply route in the morning, at noon, in the evening, and at bedtime. 1 kit 0   . Blood Pressure Monitoring (BLOOD PRESSURE KIT) DEVI 1 kit by Does not apply route once a week. 1 each 0   . glucose blood (ACCU-CHEK GUIDE) test strip 1 each by Other route in the morning, at noon, in the evening, and at bedtime. Use as instructed 100 each 12   . Lancets Misc. (ACCU-CHEK FASTCLIX LANCET) KIT 1 each by Does not apply route in the morning, at noon, in the evening, and at bedtime. 1 kit 0   . Magnesium Oxide (MAG-OXIDE) 200 MG TABS Take 2 tablets (400 mg total) by mouth at bedtime. If that amount causes loose stools in the am, switch to 263m daily at bedtime. 60 tablet 3    . metroNIDAZOLE (FLAGYL) 500 MG tablet Take 1 tablet (500 mg total) by mouth 2 (two) times daily. (Patient not taking: Reported on 01/28/2021) 14 tablet 0   . Misc. Devices (GOJJI WEIGHT SCALE) MISC 1 Device by Does not apply route daily as needed. To weight self daily as needed at home. ICD-10 code: Z34.90 1 each 0   . promethazine (PHENERGAN) 25 MG tablet Take 1 tablet (25 mg total) by mouth every 6 (six) hours as needed for nausea or vomiting. (Patient not taking: No sig reported) 30 tablet 1   . triamcinolone (KENALOG) 0.1 % Apply 1 application topically 2 (two) times daily. (Patient not taking: No sig reported) 30 g 0      Review of Systems   All systems reviewed and negative except as stated in HPI  Blood pressure 122/71, pulse 80, temperature 98 F (36.7 C), temperature source Oral, resp. rate 18, height '5\' 2"'  (1.575 m), weight 91.6 kg, last menstrual period 08/16/2020, SpO2 100 %. General appearance: alert, cooperative and no distress Lungs: normal effort  Abdomen: soft, non-tender; bowel sounds normal Presentation: cephalic Fetal monitoring: Baseline: 145 bpm, Variability: Good {> 6 bpm), Accelerations: Reactive and Decelerations: Absent Uterine activityFrequency: Every 2-4 minutes and Duration: 60-80 seconds Dilation: 8 Effacement (%): 80 Station: -1 Exam by:: Dr. WFrazier Richards  Prenatal labs: ABO, Rh: --/--/A POS (06/03 0131) Antibody: NEG (06/03 0131) Rubella: 1.17 (12/22 1115) RPR: Non Reactive (03/16 0828)  HBsAg: Negative (12/22 1115)  HIV: Non Reactive (03/16 0828)  GBS: Negative/-- (05/12 1603)  2hr gtt: fasting 104, 1hr 106, 2hr 97 Genetic screening  normal Anatomy [place]  Prenatal Transfer Tool  Maternal Diabetes: Yes:  Diabetes Type:  Diet controlled Genetic Screening: Normal Maternal Ultrasounds/Referrals: Normal Fetal Ultrasounds or other Referrals:  None Maternal Substance Abuse:  No Significant Maternal Medications:  None Significant Maternal Lab  Results: Group B Strep negative  Results for orders placed or performed during the hospital encounter of 05/21/21 (from the past 24 hour(s))  Resp Panel by RT-PCR (Flu A&B, Covid) Nasopharyngeal Swab   Collection Time: 05/22/21  1:10 AM   Specimen: Nasopharyngeal Swab; Nasopharyngeal(NP) swabs in vial transport medium  Result Value Ref Range   SARS Coronavirus 2 by RT PCR NEGATIVE NEGATIVE   Influenza A by PCR NEGATIVE NEGATIVE   Influenza B by PCR NEGATIVE NEGATIVE  CBC   Collection Time: 05/22/21  1:31 AM  Result Value Ref Range   WBC 13.5 (H) 4.0 - 10.5 K/uL  RBC 4.10 3.87 - 5.11 MIL/uL   Hemoglobin 11.6 (L) 12.0 - 15.0 g/dL   HCT 36.6 36.0 - 46.0 %   MCV 89.3 80.0 - 100.0 fL   MCH 28.3 26.0 - 34.0 pg   MCHC 31.7 30.0 - 36.0 g/dL   RDW 14.2 11.5 - 15.5 %   Platelets 166 150 - 400 K/uL   nRBC 0.0 0.0 - 0.2 %  Type and screen [place]   Collection Time: 05/22/21  1:31 AM  Result Value Ref Range   ABO/RH(D) A POS    Antibody Screen NEG    Sample Expiration      05/25/2021,2359 Performed at [facility], [place] [address], [place], [place] [address]     Patient Active Problem List   Diagnosis Date Noted  . Gestational diabetes mellitus (GDM), antepartum 03/18/2021  . Supervision of other normal pregnancy, antepartum 11/18/2020    Assessment/Plan:  Olivia Mcfarland is a 30 y.o. G3P2002 at 14w6dwith GDMA1 here for SOL  #Labor: Progressing well without augmentation. SROM around 0430 for clear fluid. Will plan to recheck in about 2 hours. Augment prn #GDMA1: CBG q4hrs during latent labor, then q2hr during active labor #Pain: Per pt request #FWB: Category 1 #ID: GBS neg #MOF: both #MOC: Depo #Circ:  n/a  WMee Hives MD  05/22/2021, 5:10 AM   I saw and evaluated the patient. I agree with the findings and the plan of care as documented in the resident's note.  JSharene Skeans MD ORidgeview InstituteFamily Medicine Fellow, [facility]ied[facility] Hospitalfor  [facility]

## 2021-05-22 NOTE — Lactation Note (Signed)
This note was copied from a baby's chart. Lactation Consultation Note  Patient Name: Olivia Mcfarland Today's Date: 05/22/2021 Reason for consult: L&D Initial assessment Age: LC visit at 30 mins PP , baby STS with mom , alert and rooting.  LC offered to assist with latch and mom receptive.  Baby's 1st latch was 5 mins , released and attempted again, repositioned and  Latched for 8 mins with swallows/ easily expressed colostrum after latch.  Switched to the right breast and baby fed for 5 mins / latch with depth and swallows/ released on her own and was content. Baby STS with mom.  LC reviewed - to feed with feeding cues / 8-12 times a day , STS feedings.  Mom aware she will be seen by [facility] again today. Latch score - 7-9-9 Maternal Data Has patient been taught Hand Expression?: Yes Per mom + breast changes with pregnancy.  Feeding Mother's Current Feeding Choice: Breast Milk  LATCH Score Latch: Grasps breast easily, tongue down, lips flanged, rhythmical sucking.  Audible Swallowing: Spontaneous and intermittent  Type of Nipple: Everted at rest and after stimulation  Comfort (Breast/Nipple): Soft / non-tender  Hold (Positioning): Assistance needed to correctly position infant at breast and maintain latch.  LATCH Score: 9   Lactation Tools Discussed/Used    Interventions Interventions: Breast feeding basics reviewed;Assisted with latch;Skin to skin;Breast compression;Education  Discharge    Consult Status Consult Status: Follow-up Date: 05/22/21 Follow-up type: In-patient    Matilde Sprang Coleby Yett 05/22/2021, 10:34 AM

## 2021-05-22 NOTE — Discharge Summary (Addendum)
Postpartum Discharge Summary  Date of Service updated 05/23/2021     Patient Name: Olivia Mcfarland DOB: 12-08-1991 MRN: [id number]  Date of admission: 05/21/2021 Delivery date:05/22/2021  Delivering provider: Lanny Cramp  Date of discharge: 05/23/2021  Admitting diagnosis: Normal labor [O80, Z37.9] Intrauterine pregnancy: [redacted]w[redacted]d    Secondary diagnosis:  Active Problems:   SVD (spontaneous vaginal delivery)  Additional problems: GDMA1   Discharge diagnosis: Term Pregnancy Delivered                                              Post partum procedures:none Augmentation: Pitocin Complications: None  Hospital course: Onset of Labor With Vaginal Delivery      30y.o. yo G3P2002 at 3106w6das admitted in Active Labor on 05/21/2021. Patient had an uncomplicated labor course as follows:  Membrane Rupture Time/Date: 4:45 AM ,05/22/2021   Delivery Method:Vaginal, Spontaneous  Episiotomy: None  Lacerations:  None  Patient had an uncomplicated postpartum course.  She is ambulating, tolerating a regular diet, passing flatus, and urinating well. Patient is discharged home in stable condition on 05/23/21.  Newborn Data: Birth date:05/22/2021  Birth time:9:37 AM  Gender:Female  Living status:Living  Apgars:8 ,9  Weight:3067 g   Magnesium Sulfate received: No BMZ received: No Rhophylac:No MMR:No T-DaP:declined Flu: No Transfusion:No  Physical exam  Vitals:   05/22/21 1742 05/22/21 2151 05/23/21 0200 05/23/21 0530  BP: 113/70 123/80 121/76 124/85  Pulse: 71 81 80 77  Resp: '18 18 18 18  ' Temp: 98 F (36.7 C) 97.7 F (36.5 C) 97.9 F (36.6 C) 97.6 F (36.4 C)  TempSrc: Oral Oral Oral Oral  SpO2:  99% 99% 100%  Weight:      Height:       General: alert, cooperative and no distress Lochia: appropriate Uterine Fundus: firm Incision: N/A DVT Evaluation: No evidence of DVT seen on physical exam. Labs: Lab Results  Component Value Date   WBC 13.5 (H) 05/22/2021   HGB 11.6 (L)  05/22/2021   HCT 36.6 05/22/2021   MCV 89.3 05/22/2021   PLT 166 05/22/2021   CMP Latest Ref Rng & Units 07/13/2019  Glucose 70 - 99 mg/dL 141(H)  BUN 6 - 20 mg/dL 10  Creatinine 0.44 - 1.00 mg/dL 0.98  Sodium 135 - 145 mmol/L 138  Potassium 3.5 - 5.1 mmol/L 3.2(L)  Chloride 98 - 111 mmol/L 107  CO2 22 - 32 mmol/L 19(L)  Calcium 8.9 - 10.3 mg/dL 9.4  Total Protein 6.5 - 8.1 g/dL 7.9  Total Bilirubin 0.3 - 1.2 mg/dL 0.6  Alkaline Phos 38 - 126 U/L 47  AST 15 - 41 U/L 26  ALT 0 - 44 U/L 18   Edinburgh Score: Edinburgh Postnatal Depression Scale Screening Tool 05/22/2021  I have been able to laugh and see the funny side of things. 0  I have looked forward with enjoyment to things. 0  I have blamed myself unnecessarily when things went wrong. 2  I have been anxious or worried for no good reason. 2  I have felt scared or panicky for no good reason. 0  Things have been getting on top of me. 1  I have been so unhappy that I have had difficulty sleeping. 0  I have felt sad or miserable. 0  I have been so unhappy that I have been crying. 0  The thought of harming myself has occurred to me. 0  Edinburgh Postnatal Depression Scale Total 5     After visit meds:  Allergies as of 05/23/2021   No Known Allergies     Medication List    STOP taking these medications   Accu-Chek FastClix Lancet Kit   Accu-Chek Guide test strip Generic drug: glucose blood   Accu-Chek Guide w/Device Kit   Blood Pressure Kit Devi   Gojji Weight Scale Misc   Mag-Oxide 200 MG Tabs Generic drug: Magnesium Oxide   metroNIDAZOLE 500 MG tablet Commonly known as: FLAGYL   promethazine 25 MG tablet Commonly known as: PHENERGAN   triamcinolone cream 0.1 % Commonly known as: KENALOG     TAKE these medications   acetaminophen 325 MG tablet Commonly known as: Tylenol Take 2 tablets (650 mg total) by mouth every 4 (four) hours as needed (for pain scale < 4).   ibuprofen 600 MG tablet Commonly known  as: ADVIL Take 1 tablet (600 mg total) by mouth every 6 (six) hours.   polyethylene glycol 17 g packet Commonly known as: MiraLax Take 17 g by mouth daily.   prenatal multivitamin Tabs tablet Take 1 tablet by mouth daily at 12 noon.        Discharge home in stable condition Infant Feeding: Bottle and Breast Infant Disposition:home with mother Discharge instruction: per After Visit Summary and Postpartum booklet. Activity: Advance as tolerated. Pelvic rest for 6 weeks.  Diet: routine diet Future Appointments: Future Appointments  Date Time Provider [place]  07/03/2021  9:30 AM Gabriel Carina, CNM [facility] None   Follow up Visit:  Message sent to [facility] on 05/22/21:  Please schedule this patient for a In person postpartum visit in 4 weeks with the following provider: Any provider. Additional Postpartum F/U:2 hour GTT  Low risk pregnancy complicated by: A1 GDM Delivery mode:  Vaginal, Spontaneous  Anticipated Birth Control:  Depo   05/23/2021 Mee Hives, MD  I saw and evaluated the patient. I agree with the findings and the plan of care as documented in the resident's note.  Sharene Skeans, MD [facility] Family Medicine Fellow, [facility] for [facility], [place]

## 2021-05-22 NOTE — Anesthesia Procedure Notes (Signed)
Epidural Patient location during procedure: OB Start time: 05/22/2021 3:51 AM End time: 05/22/2021 3:55 AM  Staffing Anesthesiologist: Kaylyn Layer, MD Performed: anesthesiologist   Preanesthetic Checklist Completed: patient identified, IV checked, risks and benefits discussed, monitors and equipment checked, pre-op evaluation and timeout performed  Epidural Patient position: sitting Prep: DuraPrep and site prepped and draped Patient monitoring: continuous pulse ox, blood pressure and heart rate Approach: midline Location: L3-L4 Injection technique: LOR air  Needle:  Needle type: Tuohy  Needle gauge: 17 G Needle length: 9 cm Needle insertion depth: 7 cm Catheter type: closed end flexible Catheter size: 19 Gauge Catheter at skin depth: 12 cm Test dose: negative and Other (1% lidocaine)  Assessment Events: blood not aspirated, injection not painful, no injection resistance, no paresthesia and negative IV test  Additional Notes Patient identified. Risks, benefits, and alternatives discussed with patient including but not limited to bleeding, infection, nerve damage, paralysis, failed block, incomplete pain control, headache, blood pressure changes, nausea, vomiting, reactions to medication, itching, and postpartum back pain. Confirmed with bedside nurse the patient's most recent platelet count. Confirmed with patient that they are not currently taking any anticoagulation, have any bleeding history, or any family history of bleeding disorders. Patient expressed understanding and wished to proceed. All questions were answered. Sterile technique was used throughout the entire procedure. Please see nursing notes for vital signs.   Crisp LOR on first pass. Test dose was given through epidural catheter and negative prior to continuing to dose epidural or start infusion. Warning signs of high block given to the patient including shortness of breath, tingling/numbness in hands, complete  motor block, or any concerning symptoms with instructions to call for help. Patient was given instructions on fall risk and not to get out of bed. All questions and concerns addressed with instructions to call with any issues or inadequate analgesia.  Reason for block:procedure for pain

## 2021-05-22 NOTE — Anesthesia Preprocedure Evaluation (Signed)
Anesthesia Evaluation  Patient identified by MRN, date of birth, ID band Patient awake    Reviewed: Allergy & Precautions, Patient's Chart, lab work & pertinent test results  History of Anesthesia Complications Negative for: history of anesthetic complications  Airway Mallampati: II  TM Distance: >3 FB Neck ROM: Full    Dental no notable dental hx.    Pulmonary neg pulmonary ROS,    Pulmonary exam normal        Cardiovascular negative cardio ROS Normal cardiovascular exam     Neuro/Psych negative neurological ROS  negative psych ROS   GI/Hepatic negative GI ROS, Neg liver ROS,   Endo/Other  diabetes, Gestational  Renal/GU negative Renal ROS  negative genitourinary   Musculoskeletal negative musculoskeletal ROS (+)   Abdominal   Peds  Hematology negative hematology ROS (+)   Anesthesia Other Findings Day of surgery medications reviewed with patient.  Reproductive/Obstetrics (+) Pregnancy                             Anesthesia Physical Anesthesia Plan  ASA: II  Anesthesia Plan: Epidural   Post-op Pain Management:    Induction:   PONV Risk Score and Plan: Treatment may vary due to age or medical condition  Airway Management Planned: Natural Airway  Additional Equipment:   Intra-op Plan:   Post-operative Plan:   Informed Consent: I have reviewed the patients History and Physical, chart, labs and discussed the procedure including the risks, benefits and alternatives for the proposed anesthesia with the patient or authorized representative who has indicated his/her understanding and acceptance.       Plan Discussed with:   Anesthesia Plan Comments:         Anesthesia Quick Evaluation  

## 2021-05-22 NOTE — Progress Notes (Signed)
LABOR PROGRESS NOTE  Olivia Mcfarland is a 30 y.o. G3P2002 at [redacted]w[redacted]d admitted for SOL with pregnancy complicated by GDMA1 moderately well controlled.   Subjective: Pt feeling painful contraction every 5 minutes or so, but less pain since starting epidural.   Objective: BP 122/65   Pulse 78   Temp 98.9 F (37.2 C) (Oral)   Resp 18   Ht 5\' 2"  (1.575 m)   Wt 91.6 kg   LMP 08/16/2020   SpO2 99%   BMI 36.95 kg/m   Dilation: 8 Effacement (%): 70,80 Station: -1 Presentation: Vertex Exam by:: Dr. [email or web address] Fetal monitoring: Baseline: 135 bpm, Variability: Good {> 6 bpm), Accelerations: Reactive, and Decelerations: Absent Uterine activity: Frequency: every 3-6 minutes  Labs: Lab Results  Component Value Date   WBC 13.5 (H) 05/22/2021   HGB 11.6 (L) 05/22/2021   HCT 36.6 05/22/2021   MCV 89.3 05/22/2021   PLT 166 05/22/2021    Patient Active Problem List   Diagnosis Date Noted  . Normal labor 05/22/2021  . Gestational diabetes mellitus (GDM), antepartum 03/18/2021  . Supervision of other normal pregnancy, antepartum 11/18/2020    Assessment / Plan: Spontaneous labor, progressing normally  #Labor: Progressing normally. SROM'd 0430. With contractions spacing out will start pitocin. Plan to recheck in 2 hours. #GDMA1: glucose q2hrs while in active labor #Fetal Wellbeing:  Category I #Pain Control: Epidural #ID: GBS negative #Anticipated MOD: NSVD   11/20/2020 MD, PGY-1 Family Medicine Resident, [facility] Faculty Teaching Service  05/22/2021, 7:05 AM

## 2021-05-22 NOTE — Anesthesia Postprocedure Evaluation (Signed)
Anesthesia Post Note  Patient: Brewing technologist  Procedure(s) Performed: AN AD HOC LABOR EPIDURAL     Patient location during evaluation: Mother Baby Anesthesia Type: Epidural Level of consciousness: awake and alert, oriented and patient cooperative Pain management: pain level controlled Vital Signs Assessment: post-procedure vital signs reviewed and stable Respiratory status: spontaneous breathing Cardiovascular status: stable Postop Assessment: no headache, epidural receding, patient able to bend at knees and no signs of nausea or vomiting Anesthetic complications: no Comments: Pt. States she is walking.  Pain score 1.    No complications documented.  Last Vitals:  Vitals:   05/22/21 1330 05/22/21 1742  BP: 121/77 113/70  Pulse: 66 71  Resp: 17 18  Temp:  36.7 C  SpO2:      Last Pain:  Vitals:   05/22/21 1743  TempSrc:   PainSc: 0-No pain   Pain Goal:                Epidural/Spinal Function Cutaneous sensation: Normal sensation (05/22/21 1743), Patient able to flex knees: Yes (05/22/21 1743), Patient able to lift hips off bed: Yes (05/22/21 1743), Back pain beyond tenderness at insertion site: No (05/22/21 1743), Progressively worsening motor and/or sensory loss: No (05/22/21 1743), Bowel and/or bladder incontinence post epidural: No (05/22/21 1743)  Merrilyn Puma

## 2021-05-22 NOTE — Lactation Note (Signed)
This note was copied from a baby's chart. Lactation Consultation Note Baby 13 hrs old.  Mom stated she is getting sore. Mom needed body alignment instructions and repositioning to obtain deeper latch. Mom stated much better. Newborn behavior, feeding habits, STS, I&O, breast massage, positioning, support, supply and demand discussed. Taught hand expression. Mom demonstrated back. Colostrum noted.  Mom has 29 yr old that she didn't BF and a 29 yr old that she tried but it didn't work out. She didn't like it. Praised mom for trying again.  Encouraged to call for questions or assistance. Lactation brochure given.  Patient Name: Olivia Mcfarland ENIDP'O Date: 05/22/2021 Reason for consult: Initial assessment;Term Age:68 hours  Maternal Data Has patient been taught Hand Expression?: Yes Does the patient have breastfeeding experience prior to this delivery?: Yes How long did the patient breastfeed?: a few days. mom stated it didn't work out.  Feeding    LATCH Score Latch: Grasps breast easily, tongue down, lips flanged, rhythmical sucking.  Audible Swallowing: A few with stimulation  Type of Nipple: Everted at rest and after stimulation  Comfort (Breast/Nipple): Filling, red/small blisters or bruises, mild/mod discomfort (getting sore/intact)  Hold (Positioning): Assistance needed to correctly position infant at breast and maintain latch.  LATCH Score: 7   Lactation Tools Discussed/Used Tools: Pump Breast pump type: Manual Pump Education: Setup, frequency, and cleaning;Milk Storage Reason for Pumping: mom requested pump Pumping frequency: PRN  Interventions Interventions: Breast feeding basics reviewed;Support pillows;Assisted with latch;Position options;Skin to skin;Breast massage;Hand express;Hand pump;Breast compression;Adjust position  Discharge [facility] Program: Yes  Consult Status Consult Status: Follow-up Date: 05/23/21 Follow-up type: In-patient    Charyl Dancer 05/22/2021, 10:44 PM

## 2021-05-23 LAB — GLUCOSE, CAPILLARY: Glucose-Capillary: 72 mg/dL (ref 70–99)

## 2021-05-23 MED ORDER — ACETAMINOPHEN 325 MG PO TABS: 650.0000 mg | ORAL_TABLET | ORAL | Status: DC | PRN

## 2021-05-23 MED ORDER — MEDROXYPROGESTERONE ACETATE 150 MG/ML IM SUSP
150.0000 mg | Freq: Once | INTRAMUSCULAR | Status: AC
  Administered 2021-05-23: 150 mg via INTRAMUSCULAR
  Filled 2021-05-23: qty 1

## 2021-05-23 MED ORDER — IBUPROFEN 600 MG PO TABS: 600.0000 mg | ORAL_TABLET | Freq: Four times a day (QID) | ORAL | 0 refills | Status: DC

## 2021-05-23 MED ORDER — POLYETHYLENE GLYCOL 3350 17 G PO PACK: 17.0000 g | PACK | Freq: Every day | ORAL | 0 refills | Status: DC

## 2021-05-23 NOTE — Lactation Note (Signed)
This note was copied from a baby's chart. Lactation Consultation Note  Patient Name: Olivia Mcfarland KGMWN'U Date: 05/23/2021 Reason for consult: Follow-up assessment Age:29 hours  P3, First time breastfeeding.  Discussed feeding frequency. Reviewed engorgement care and monitoring voids/stools.   Interventions Interventions: Breast feeding basics reviewed;Education  Discharge Discharge Education: Engorgement and breast care;Warning signs for feeding baby  Consult Status Consult Status: Follow-up Date: 05/23/21 Follow-up type: In-patient    Dahlia Byes [facility] 05/23/2021, 9:15 AM

## 2021-05-23 NOTE — Clinical Social Work Maternal (Signed)
CLINICAL SOCIAL WORK MATERNAL/CHILD NOTE  Patient Details  Name: Olivia Mcfarland MRN: [id number] Date of Birth: 07/13/1991  Date:  05/23/2021  Clinical Social Worker Initiating Note:  Lola Czerwonka, MSW, LCSWA Date/Time: Initiated:  05/23/21/1152     Child's Name:  Olivia Mcfarland   Biological Parents:  Mother,Father (Olivia Mcfarland, 05/27/91, [phone])   Need for Interpreter:  None   Reason for Referral:  Current Substance Use/Substance Use During Pregnancy    Address:  [address] [place] [address]    Phone number:  [phone] (home)     Additional phone number:   Household Members/Support Persons (HM/SP):   Household Member/Support Person 1,Household Member/Support Person 2,Household Member/Support Person 3   HM/SP Name Relationship DOB or Age  HM/SP -1 Olivia Mcfarland FOB 05/27/91  HM/SP -2 Lauryn Hutchson Daughter 09/05/11  HM/SP -3 Jamir Mcfarland Son 12/09/18  HM/SP -4        HM/SP -5        HM/SP -6        HM/SP -7        HM/SP -8          Natural Supports (not living in the home):  Parent,Extended Family,Immediate Family   Professional Supports:     Employment: Part-time   Type of Work: [employer]   Education:  Some College   Homebound arranged:    Financial Resources:  Medicaid   Other Resources:  WIC,Food Stamps    Cultural/Religious Considerations Which May Impact Care:    Strengths:  Ability to meet basic needs ,Home prepared for child ,Pediatrician chosen   Psychotropic Medications:         Pediatrician:    [place] area  Pediatrician List:   [place] Other ([facility] [place])  [place]    [place]    [place]    [place]    [place]      Pediatrician Fax Number:    Risk Factors/Current Problems:  Substance Use    Cognitive State:  Able to Concentrate ,Alert ,Insightful ,Linear Thinking    Mood/Affect:  Comfortable ,Interested ,Relaxed  ,Bright ,Calm ,Happy    CSW Assessment: CSW met with MOB to complete consult for substance use during pregnancy. CSW observed MOB resting in bed with infant on chest. CSW explained role and reason for consult. MOB was pleasant, polite, and engaged with CSW. MOB reported, her last use of THC was approximately 30 days ago. MOB reported, the reason for THC was for appetite purposes. MOB reported, she was not able to keep food down without usage. MOB denied any other illicit substances usage or CPS involvement.   CSW informed MOB of Drug Screen Policy and MOB was understanding of protocol. CSW made CPS report to [place] with [place]. CSW will follow the CDS and will make CPS aware of any additional information.  CSW provided education regarding the baby blues period vs. perinatal mood disorders, discussed treatment and gave resources for mental health follow up if concerns arise. CSW recommends self- evaluation during the postpartum time period using the New Mom Checklist from Postpartum Progress and encouraged MOB to contact a medical professional if symptoms are noted at any time.   When CSW asked MOB of her emotions since delivery. MOB reported, she feels relieve and great since infant's arrival. MOB reported, parents and family are her supports. MOB denied SI, HI, and DV when CSW assessed for safety.   MOB reported,   she receives both WIC, and food stamps. MOB reported, infant's pediatrician will be at [facility] [place] and there are no barriers to follow up infant's care. MOB reported, she has all essentials needed to care for infant. MOB reported, infant has a car seat and bassinet. MOB denied any additional barriers.     CSW provided education on sudden infant death syndrome (SIDS).   CSW will follow the CDS and will make CPS aware of any additional information.  CSW Plan/Description:  No Further Intervention Required/No Barriers to  Discharge,Sudden Infant Death Syndrome (SIDS) Education,Perinatal Mood and Anxiety Disorder (PMADs) Education,Hospital Drug Screen Policy Information,Child Protective Service Report ,CSW Will Continue to Monitor Umbilical Cord Tissue Drug Screen Results and Make Report if Warranted    Aaria Happ, LCSWA 05/23/2021, 12:00 PM  Takoda Siedlecki, MSW, LCSW-A Clinical Social Worker [phone]  

## 2021-05-25 ENCOUNTER — Telehealth: Payer: Self-pay

## 2021-05-25 NOTE — Telephone Encounter (Signed)
Transition Care Management Unsuccessful Follow-up Telephone Call  Date of discharge and from where:  05/23/2021 from [facility]  Attempts:  1st Attempt  Reason for unsuccessful TCM follow-up call:  Left voice message

## 2021-05-26 NOTE — Telephone Encounter (Signed)
Transition Care Management Unsuccessful Follow-up Telephone Call  Date of discharge and from where:  05/23/2021 from [facility]  Attempts:  2nd Attempt  Reason for unsuccessful TCM follow-up call:  Left voice message

## 2021-05-27 NOTE — Telephone Encounter (Signed)
Transition Care Management Unsuccessful Follow-up Telephone Call  Date of discharge and from where:  05/23/2021 from [facility]  Attempts:  3rd Attempt  Reason for unsuccessful TCM follow-up call:  Unable to reach patient

## 2021-06-19 DIAGNOSIS — Z419 Encounter for procedure for purposes other than remedying health state, unspecified: Secondary | ICD-10-CM | POA: Diagnosis not present

## 2021-07-03 ENCOUNTER — Other Ambulatory Visit: Payer: Self-pay

## 2021-07-03 ENCOUNTER — Ambulatory Visit (INDEPENDENT_AMBULATORY_CARE_PROVIDER_SITE_OTHER): Payer: Medicaid Other | Admitting: Certified Nurse Midwife

## 2021-07-03 ENCOUNTER — Other Ambulatory Visit (INDEPENDENT_AMBULATORY_CARE_PROVIDER_SITE_OTHER): Payer: Medicaid Other | Admitting: *Deleted

## 2021-07-03 ENCOUNTER — Encounter: Payer: Self-pay | Admitting: Certified Nurse Midwife

## 2021-07-03 DIAGNOSIS — O24410 Gestational diabetes mellitus in pregnancy, diet controlled: Secondary | ICD-10-CM | POA: Diagnosis not present

## 2021-07-03 DIAGNOSIS — Z131 Encounter for screening for diabetes mellitus: Secondary | ICD-10-CM | POA: Diagnosis not present

## 2021-07-03 NOTE — Progress Notes (Signed)
    Patient in clinic for postpartum 2 hour gtt.  Clovis Pu, RN

## 2021-07-03 NOTE — Progress Notes (Signed)
Post Partum Visit Note  Olivia Mcfarland is a 30 y.o. G31P3003 female who presents for a postpartum visit. She is 6 weeks postpartum following a normal spontaneous vaginal delivery.  I have fully reviewed the prenatal and intrapartum course. The delivery was at 39.6 gestational weeks.  Anesthesia: epidural. Postpartum course has been good. Baby is doing well, already above 9lbs at last visit. Baby is feeding by both breast and bottle - Gerber Gentle . Bleeding staining only. Bowel function is normal. Bladder function is normal. Patient is not sexually active. Contraception method is Depo-Provera injections. Postpartum depression screening: negative.  The pregnancy intention screening data noted above was reviewed. Patient will continue with Depo-Provera for pregnancy prevention.   Edinburgh Postnatal Depression Scale - 07/03/21 0854       Edinburgh Postnatal Depression Scale:  In the Past 7 Days   I have been able to laugh and see the funny side of things. 0    I have looked forward with enjoyment to things. 0    I have blamed myself unnecessarily when things went wrong. 0    I have been anxious or worried for no good reason. 0    I have felt scared or panicky for no good reason. 0    Things have been getting on top of me. 0    I have been so unhappy that I have had difficulty sleeping. 0    I have felt sad or miserable. 0    I have been so unhappy that I have been crying. 0    The thought of harming myself has occurred to me. 0    Edinburgh Postnatal Depression Scale Total 0            Health Maintenance Due  Topic Date Due   URINE MICROALBUMIN  Never done   The following portions of the patient's history were reviewed and updated as appropriate: allergies, current medications, past family history, past medical history, past social history, past surgical history, and problem list.  Review of Systems Pertinent items noted in HPI and remainder of comprehensive ROS otherwise  negative.  Objective:  BP 128/89 (BP Location: Left Arm, Patient Position: Sitting, Cuff Size: Normal)   Pulse 92   Temp 98.3 F (36.8 C) (Oral)   Ht 5\' 1"  (1.549 m)   Wt 188 lb 12.8 oz (85.6 kg)   LMP 08/16/2020   Breastfeeding Yes   BMI 35.67 kg/m    General:  alert, cooperative, appears stated age, and no distress   Breasts:  normal  Lungs: Normal effort  Heart:  regular rate and rhythm  Abdomen: Soft, non-tender    Wound N/A  GU exam:  not indicated       Assessment:  Postpartum care and examination Diabetes mellitus screening - 2hr screening today  Plan:   Essential components of care per ACOG recommendations:  1.  Mood and well being: Patient with negative depression screening today. Reviewed local resources for support.  - Patient tobacco use? No.   - hx of drug use? No.    2. Infant care and feeding:  -Patient currently breastmilk feeding? Yes. Reviewed importance of draining breast regularly to support lactation.  -Social determinants of health (SDOH) reviewed in EPIC. No concerns  3. Sexuality, contraception and birth spacing - Patient does not want a pregnancy in the next year.  Desired family size is 3 children.  - Reviewed forms of contraception in tiered fashion. Patient desired Depo-Provera today (given in  hospital, will need re-dose in 6wks) - Discussed birth spacing of 18 months  4. Sleep and fatigue -Encouraged family/partner/community support of 4 hrs of uninterrupted sleep to help with mood and fatigue  5. Physical Recovery  - Discussed patients delivery and complications. She describes her labor as good. - Patient had a Vaginal, no problems at delivery. Patient had  no  laceration. Perineal healing reviewed. Patient expressed understanding - Patient has urinary incontinence? No. - Patient is safe to resume physical and sexual activity  6.  Health Maintenance - HM due items addressed Yes - Last pap smear  Diagnosis  Date Value Ref Range  Status  12/10/2020   Final   - Negative for intraepithelial lesion or malignancy (NILM)   Pap smear not done at today's visit.  -Breast Cancer screening indicated? No.   7. Chronic Disease/Pregnancy Condition follow up: Gestational Diabetes - PCP follow up as needed, will make referral to Family Medicine - [facility]   Bernerd Limbo, CNM [facility] [employer], [facility]

## 2021-07-04 LAB — GLUCOSE TOLERANCE, 2 HOURS
Glucose, 2 hour: 72 mg/dL (ref 65–139)
Glucose, GTT - Fasting: 81 mg/dL (ref 65–99)

## 2021-07-20 DIAGNOSIS — Z419 Encounter for procedure for purposes other than remedying health state, unspecified: Secondary | ICD-10-CM | POA: Diagnosis not present

## 2021-08-06 ENCOUNTER — Other Ambulatory Visit: Payer: Self-pay | Admitting: *Deleted

## 2021-08-06 DIAGNOSIS — Z3042 Encounter for surveillance of injectable contraceptive: Secondary | ICD-10-CM

## 2021-08-06 MED ORDER — MEDROXYPROGESTERONE ACETATE 150 MG/ML IM SUSP: 150.0000 mg | INTRAMUSCULAR | 3 refills | Status: AC

## 2021-08-06 NOTE — Progress Notes (Signed)
Depo Provera order sent to pharmacy for upcoming appointment.  Clovis Pu, RN

## 2021-08-10 ENCOUNTER — Ambulatory Visit: Payer: Medicaid Other

## 2021-08-11 ENCOUNTER — Ambulatory Visit: Payer: Medicaid Other

## 2021-08-20 DIAGNOSIS — Z419 Encounter for procedure for purposes other than remedying health state, unspecified: Secondary | ICD-10-CM | POA: Diagnosis not present

## 2021-09-14 DIAGNOSIS — M542 Cervicalgia: Secondary | ICD-10-CM | POA: Diagnosis not present

## 2021-09-14 DIAGNOSIS — Z3202 Encounter for pregnancy test, result negative: Secondary | ICD-10-CM | POA: Diagnosis not present

## 2021-09-14 DIAGNOSIS — S098XXA Other specified injuries of head, initial encounter: Secondary | ICD-10-CM | POA: Diagnosis not present

## 2021-09-14 DIAGNOSIS — Y92410 Unspecified street and highway as the place of occurrence of the external cause: Secondary | ICD-10-CM | POA: Diagnosis not present

## 2021-09-14 DIAGNOSIS — Y999 Unspecified external cause status: Secondary | ICD-10-CM | POA: Diagnosis not present

## 2021-09-14 DIAGNOSIS — M545 Low back pain, unspecified: Secondary | ICD-10-CM | POA: Diagnosis not present

## 2021-09-14 DIAGNOSIS — R519 Headache, unspecified: Secondary | ICD-10-CM | POA: Diagnosis not present

## 2021-09-14 DIAGNOSIS — M546 Pain in thoracic spine: Secondary | ICD-10-CM | POA: Diagnosis not present

## 2021-09-15 ENCOUNTER — Telehealth: Payer: Self-pay

## 2021-09-15 NOTE — Telephone Encounter (Signed)
Transition Care Management Follow-up Telephone Call Date of discharge and from where: 09/15/2021-[facility]  How have you been since you were released from the hospital? Patient stated she is doing fine  Any questions or concerns? No  Items Reviewed: Did the pt receive and understand the discharge instructions provided? Yes  Medications obtained and verified? Yes  Other? No  Any new allergies since your discharge? No  Dietary orders reviewed? No Do you have support at home? Yes   Home Care and Equipment/Supplies: Were home health services ordered? not applicable If so, what is the name of the agency? N/A  Has the agency set up a time to come to the patient's home? not applicable Were any new equipment or medical supplies ordered?  No What is the name of the medical supply agency? N/A Were you able to get the supplies/equipment? not applicable Do you have any questions related to the use of the equipment or supplies? No  Functional Questionnaire: (I = Independent and D = Dependent) ADLs: I  Bathing/Dressing- I  Meal Prep- I  Eating- I  Maintaining continence- I  Transferring/Ambulation- I  Managing Meds- I  Follow up appointments reviewed:  PCP Hospital f/u appt confirmed? No   Specialist Hospital f/u appt confirmed? No   Are transportation arrangements needed? No  If their condition worsens, is the pt aware to call PCP or go to the Emergency Dept.? Yes Was the patient provided with contact information for the PCP's office or ED? Yes Was to pt encouraged to call back with questions or concerns? Yes

## 2021-09-19 DIAGNOSIS — Z419 Encounter for procedure for purposes other than remedying health state, unspecified: Secondary | ICD-10-CM | POA: Diagnosis not present

## 2021-10-20 DIAGNOSIS — Z419 Encounter for procedure for purposes other than remedying health state, unspecified: Secondary | ICD-10-CM | POA: Diagnosis not present

## 2021-11-19 DIAGNOSIS — Z419 Encounter for procedure for purposes other than remedying health state, unspecified: Secondary | ICD-10-CM | POA: Diagnosis not present

## 2021-11-25 ENCOUNTER — Encounter (HOSPITAL_COMMUNITY): Payer: Self-pay

## 2021-11-25 ENCOUNTER — Ambulatory Visit (INDEPENDENT_AMBULATORY_CARE_PROVIDER_SITE_OTHER): Payer: Medicaid Other

## 2021-11-25 ENCOUNTER — Other Ambulatory Visit: Payer: Self-pay

## 2021-11-25 ENCOUNTER — Ambulatory Visit (HOSPITAL_COMMUNITY)
Admission: EM | Admit: 2021-11-25 | Discharge: 2021-11-25 | Disposition: A | Discharge status: Home / Self Care | Payer: Medicaid Other | Attending: Family Medicine | Admitting: Family Medicine

## 2021-11-25 DIAGNOSIS — M25562 Pain in left knee: Secondary | ICD-10-CM

## 2021-11-25 DIAGNOSIS — S52502A Unspecified fracture of the lower end of left radius, initial encounter for closed fracture: Secondary | ICD-10-CM

## 2021-11-25 DIAGNOSIS — S8992XA Unspecified injury of left lower leg, initial encounter: Secondary | ICD-10-CM | POA: Diagnosis not present

## 2021-11-25 DIAGNOSIS — M7989 Other specified soft tissue disorders: Secondary | ICD-10-CM | POA: Diagnosis not present

## 2021-11-25 DIAGNOSIS — M25532 Pain in left wrist: Secondary | ICD-10-CM

## 2021-11-25 IMAGING — DX DG KNEE COMPLETE 4+V*L*
4 series · 4 of 4 positions shown · non-contrast
Comparison: Left knee radiograph dated [DATE].

CLINICAL DATA: Trauma to the left knee.

EXAM:
LEFT KNEE - COMPLETE 4+ VIEW

[knee ap]
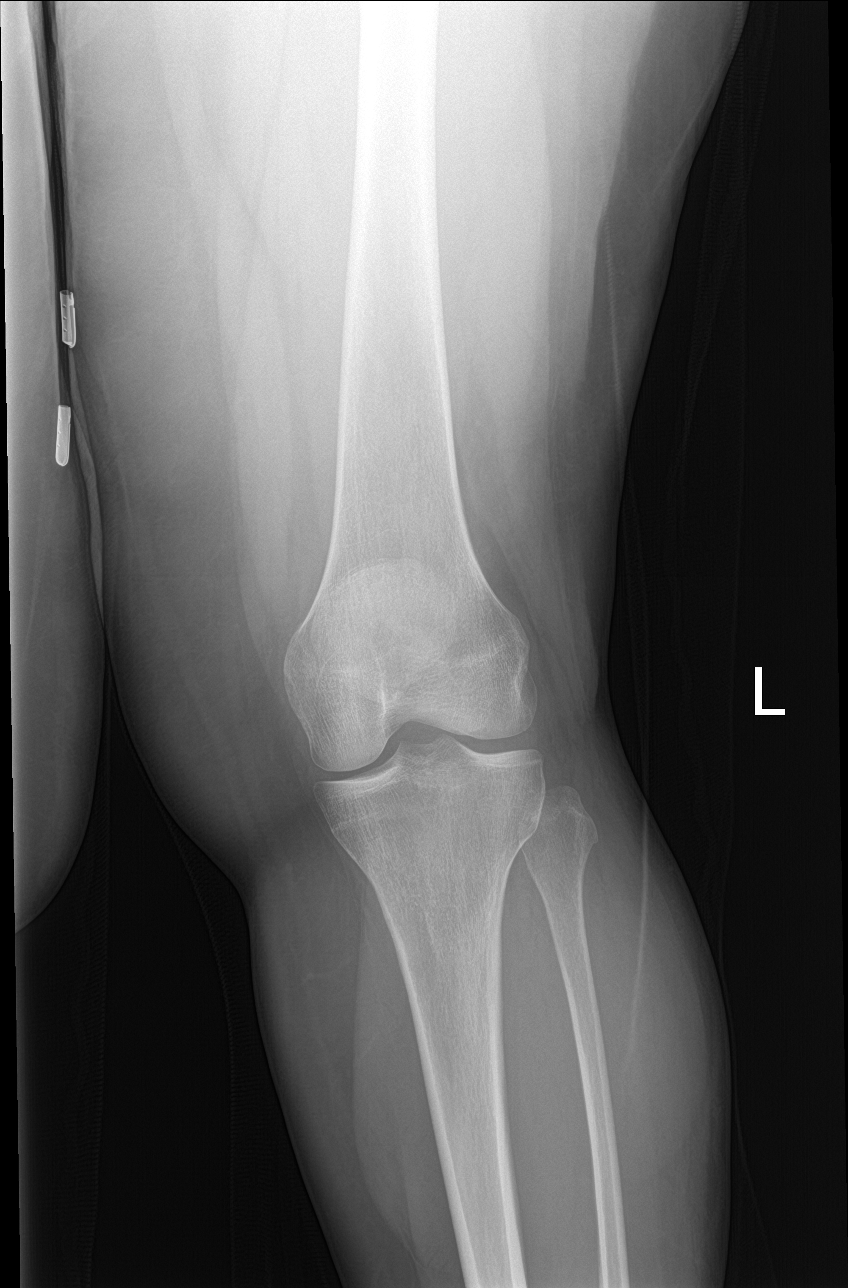

[knee obl (1 of 2)]
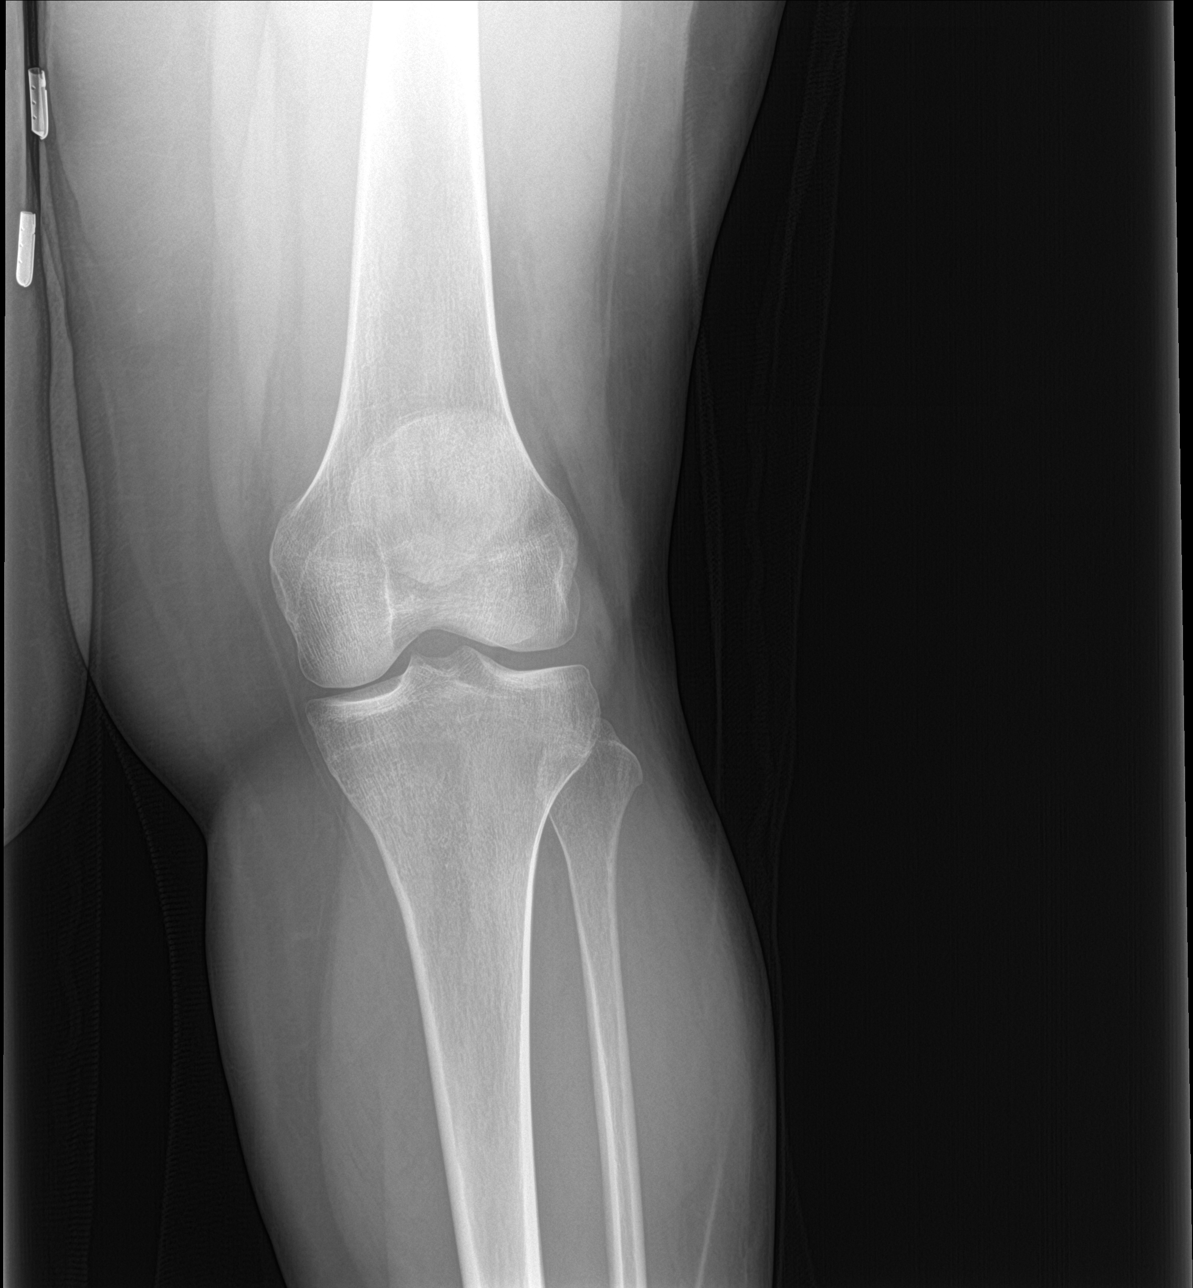

[knee obl (2 of 2)]
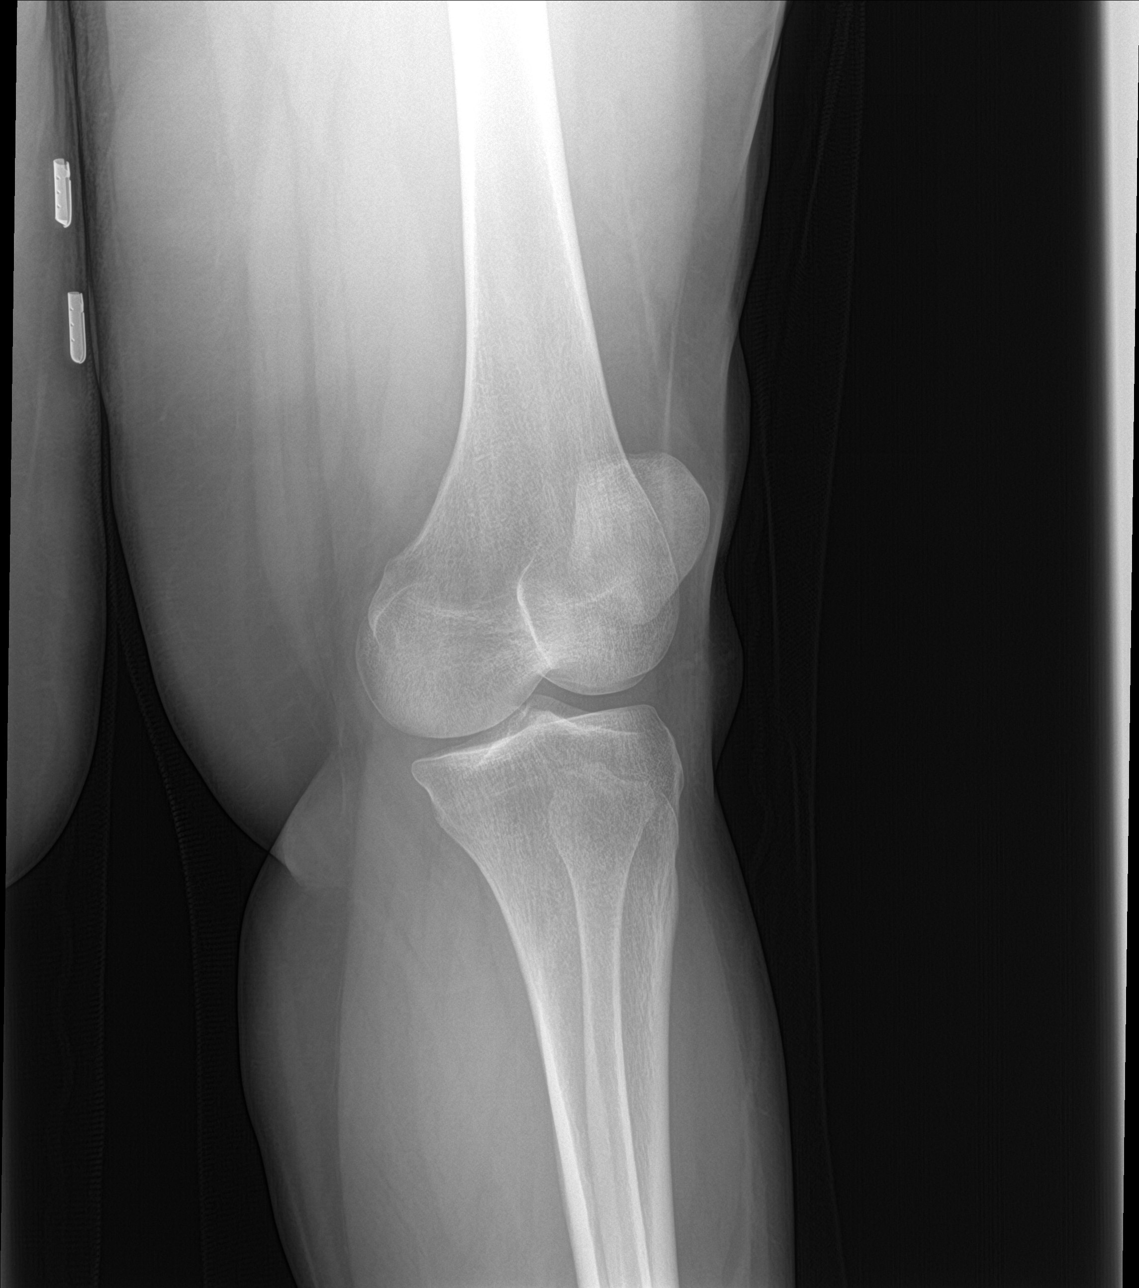

[knee lat]
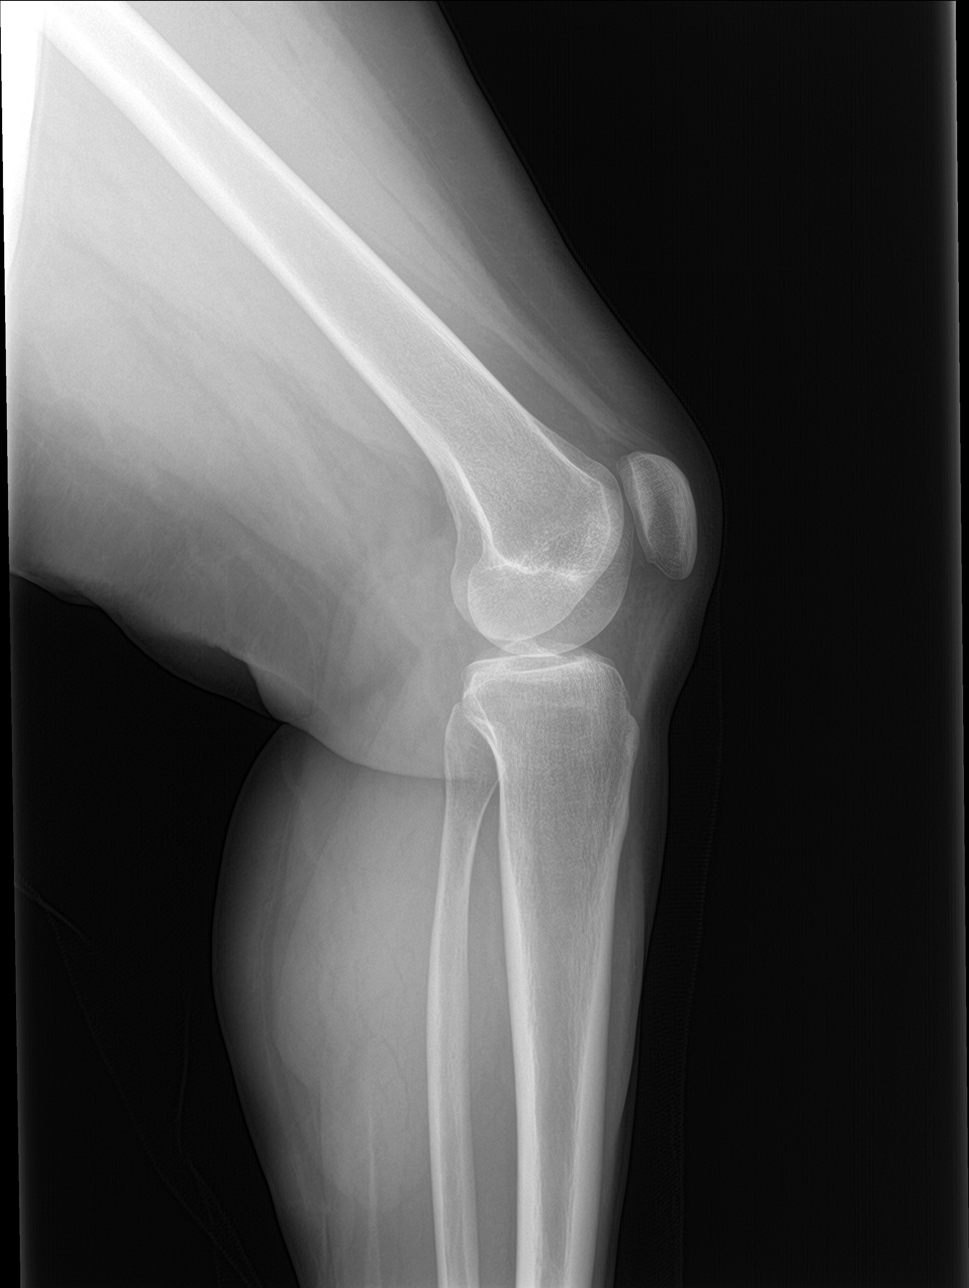

[4 of 4 positions shown; findings below may reference images not displayed]

FINDINGS: No evidence of fracture, dislocation, or joint effusion. No evidence
of arthropathy or other focal bone abnormality. Soft tissues are
unremarkable.
IMPRESSION: Negative.

## 2021-11-25 MED ORDER — HYDROCODONE-ACETAMINOPHEN 5-325 MG PO TABS: 1.0000 | ORAL_TABLET | Freq: Four times a day (QID) | ORAL | 0 refills | Status: AC | PRN

## 2021-11-25 NOTE — Discharge Instructions (Addendum)

## 2021-11-25 NOTE — ED Triage Notes (Signed)
Pt presents with left wrist & knee injury after a fall at a store today.

## 2021-11-25 NOTE — Progress Notes (Signed)
Orthopedic Tech Progress Note Patient Details:  Olivia Mcfarland 09/03/1991 [id number]  Ortho Devices Type of Ortho Device: Arm sling, [place]artong splint Ortho Device/Splint Location: left arm Ortho Device/Splint Interventions: Ordered, Application, Adjustment   Post Interventions Patient Tolerated: Well Instructions Provided: Care of device, Adjustment of device  [name] 11/25/2021, 6:35 PM Applied splint to left arm and applied shoulder sling.

## 2021-11-26 DIAGNOSIS — S52502A Unspecified fracture of the lower end of left radius, initial encounter for closed fracture: Secondary | ICD-10-CM | POA: Insufficient documentation

## 2021-11-26 DIAGNOSIS — S52532A Colles' fracture of left radius, initial encounter for closed fracture: Secondary | ICD-10-CM | POA: Diagnosis not present

## 2021-11-26 NOTE — ED Provider Notes (Signed)
[facility]   [id number] 11/25/21 Arrival Time: 1518  ASSESSMENT & PLAN:  1. Closed fracture of distal end of left radius, unspecified fracture morphology, initial encounter   2. Left wrist pain   3. Acute pain of left knee     I have personally viewed the imaging studies ordered this visit. Distal radius fracture. Normal knee.  Sugar-tong splint applied by orthopaedic tech.  Meds ordered this encounter  Medications   HYDROcodone-acetaminophen (NORCO/VICODIN) 5-325 MG tablet    Sig: Take 1 tablet by mouth every 6 (six) hours as needed for moderate pain or severe pain.    Dispense:  12 tablet    Refill:  0    Orders Placed This Encounter  Procedures   DG Wrist Complete Left   DG Knee Complete 4 Views Left   Apply splint short arm   Apply Shoulder Immobilizer/Sling    Recommend:  Follow-up Information     Bradly Bienenstock, MD. Schedule an appointment as soon as possible for a visit .   Specialty: Orthopedic Surgery Contact information: [address] [place] [address] [place] [address] [phone]                 [place] Controlled Substances Registry consulted for this patient. I feel the risk/benefit ratio today is favorable for proceeding with this prescription for a controlled substance. Medication sedation precautions given.  Reviewed expectations re: course of current medical issues. Questions answered. Outlined signs and symptoms indicating need for more acute intervention. Patient verbalized understanding. After Visit Summary given.  SUBJECTIVE: History from: patient. Olivia Mcfarland is a 30 y.o. female who reports L wrist and knee pain s/p fall today. Wrist more painful. Able to bear wt on leg after fall. No extremity sensation changes or weakness. No tx PTA.   Past Surgical History:  Procedure Laterality Date   NO PAST SURGERIES        OBJECTIVE:  Vitals:   11/25/21 1726  BP: (!) 138/91  Pulse: 90  Resp: 18  Temp: 98 F (36.7  C)  TempSrc: Oral  SpO2: 98%    General appearance: alert; no distress HEENT: [place]; AT Neck: supple with FROM Resp: unlabored respirations Extremities: LUE: warm with well perfused appearance; fairly well localized marked tenderness over LEFT distal radius; without gross deformities; swelling: moderate; bruising: none; wrist ROM: limited by reported pain LLE: vague soreness to palpation over L knee; no swelling; FROM CV: brisk extremity capillary refill of all extremities Skin: warm and dry; no visible rashes Neurologic: normal sensation and strength of all extremities Psychological: alert and cooperative; normal mood and affect  Imaging: DG Wrist Complete Left  Result Date: 11/25/2021 CLINICAL DATA:  A 30 year old female presents following fall onto LEFT wrist. EXAM: LEFT WRIST - COMPLETE 3+ VIEW COMPARISON:  None FINDINGS: Impacted mildly comminuted impacted fracture of the distal radius with mild foreshortening of the distal radius. Fracture extends into the radiocarpal joint. Moderate soft tissue swelling over the volar aspect of the wrist. No additional fracture is identified. IMPRESSION: Impacted mildly comminuted fracture of the distal radius with intra-articular extension to the radiocarpal joint. Moderate soft tissue swelling over the volar aspect of the wrist. These results will be called to the ordering clinician or representative by the Radiologist Assistant, and communication documented in the PACS or [employer]. Electronically Signed   By: Donzetta Kohut M.D.   On: 11/25/2021 17:57   DG Knee Complete 4 Views Left  Result Date: 11/25/2021 CLINICAL DATA:  Trauma to  the left knee. EXAM: LEFT KNEE - COMPLETE 4+ VIEW COMPARISON:  Left knee radiograph dated 06/12/2015. FINDINGS: No evidence of fracture, dislocation, or joint effusion. No evidence of arthropathy or other focal bone abnormality. Soft tissues are unremarkable. IMPRESSION: Negative. Electronically Signed   By: Elgie Collard M.D.   On: 11/25/2021 18:05      No Known Allergies  Past Medical History:  Diagnosis Date   Gestational diabetes mellitus (GDM), antepartum 03/18/2021   Medical history non-contributory    SVD (spontaneous vaginal delivery) 05/22/2021   Social History   Socioeconomic History   Marital status: Significant Other    Spouse name: Not on file   Number of children: 2   Years of education: Not on file   Highest education level: Not on file  Occupational History   Occupation: Unemployed  Tobacco Use   Smoking status: Never   Smokeless tobacco: Never  Vaping Use   Vaping Use: Never used  Substance and Sexual Activity   Alcohol use: No   Drug use: Yes    Types: Marijuana    Comment: Last time 1 week ago   Sexual activity: Yes    Birth control/protection: None  Other Topics Concern   Not on file  Social History Narrative   Not on file   Social Determinants of Health   Financial Resource Strain: Not on file  Food Insecurity: Not on file  Transportation Needs: Not on file  Physical Activity: Not on file  Stress: Not on file  Social Connections: Not on file   Family History  Problem Relation Age of Onset   Ovarian cancer Mother    Past Surgical History:  Procedure Laterality Date   NO PAST SURGERIES         Mardella Layman, MD 11/26/21 [phone]

## 2021-12-10 DIAGNOSIS — S52532A Colles' fracture of left radius, initial encounter for closed fracture: Secondary | ICD-10-CM | POA: Diagnosis not present

## 2021-12-20 DIAGNOSIS — Z419 Encounter for procedure for purposes other than remedying health state, unspecified: Secondary | ICD-10-CM | POA: Diagnosis not present

## 2022-01-11 DIAGNOSIS — S52532A Colles' fracture of left radius, initial encounter for closed fracture: Secondary | ICD-10-CM | POA: Diagnosis not present

## 2022-01-20 DIAGNOSIS — Z419 Encounter for procedure for purposes other than remedying health state, unspecified: Secondary | ICD-10-CM | POA: Diagnosis not present

## 2022-02-17 DIAGNOSIS — Z419 Encounter for procedure for purposes other than remedying health state, unspecified: Secondary | ICD-10-CM | POA: Diagnosis not present

## 2022-03-20 DIAGNOSIS — Z419 Encounter for procedure for purposes other than remedying health state, unspecified: Secondary | ICD-10-CM | POA: Diagnosis not present

## 2022-04-19 DIAGNOSIS — Z419 Encounter for procedure for purposes other than remedying health state, unspecified: Secondary | ICD-10-CM | POA: Diagnosis not present

## 2022-05-20 DIAGNOSIS — Z419 Encounter for procedure for purposes other than remedying health state, unspecified: Secondary | ICD-10-CM | POA: Diagnosis not present

## 2022-06-19 DIAGNOSIS — Z419 Encounter for procedure for purposes other than remedying health state, unspecified: Secondary | ICD-10-CM | POA: Diagnosis not present

## 2022-07-20 DIAGNOSIS — Z419 Encounter for procedure for purposes other than remedying health state, unspecified: Secondary | ICD-10-CM | POA: Diagnosis not present

## 2022-08-20 DIAGNOSIS — Z419 Encounter for procedure for purposes other than remedying health state, unspecified: Secondary | ICD-10-CM | POA: Diagnosis not present

## 2022-09-19 DIAGNOSIS — Z419 Encounter for procedure for purposes other than remedying health state, unspecified: Secondary | ICD-10-CM | POA: Diagnosis not present

## 2022-10-20 DIAGNOSIS — Z419 Encounter for procedure for purposes other than remedying health state, unspecified: Secondary | ICD-10-CM | POA: Diagnosis not present

## 2022-11-19 DIAGNOSIS — Z419 Encounter for procedure for purposes other than remedying health state, unspecified: Secondary | ICD-10-CM | POA: Diagnosis not present

## 2022-12-20 DIAGNOSIS — Z419 Encounter for procedure for purposes other than remedying health state, unspecified: Secondary | ICD-10-CM | POA: Diagnosis not present

## 2023-01-20 DIAGNOSIS — Z419 Encounter for procedure for purposes other than remedying health state, unspecified: Secondary | ICD-10-CM | POA: Diagnosis not present

## 2023-02-15 ENCOUNTER — Ambulatory Visit: Payer: Medicaid Other | Admitting: Advanced Practice Midwife

## 2023-02-15 ENCOUNTER — Other Ambulatory Visit (HOSPITAL_COMMUNITY)
Admission: RE | Admit: 2023-02-15 | Discharge: 2023-02-15 | Disposition: A | Discharge status: Home / Self Care | Payer: Medicaid Other | Source: Ambulatory Visit | Attending: Advanced Practice Midwife | Admitting: Advanced Practice Midwife

## 2023-02-15 ENCOUNTER — Encounter: Payer: Self-pay | Admitting: Advanced Practice Midwife

## 2023-02-15 VITALS — BP 137/87 | HR 83 | Ht 62.0 in | Wt 189.0 lb

## 2023-02-15 DIAGNOSIS — Z3202 Encounter for pregnancy test, result negative: Secondary | ICD-10-CM

## 2023-02-15 DIAGNOSIS — Z01419 Encounter for gynecological examination (general) (routine) without abnormal findings: Secondary | ICD-10-CM | POA: Insufficient documentation

## 2023-02-15 DIAGNOSIS — Z3009 Encounter for other general counseling and advice on contraception: Secondary | ICD-10-CM

## 2023-02-15 LAB — POCT URINE PREGNANCY: Preg Test, Ur: NEGATIVE

## 2023-02-15 MED ORDER — NORETHINDRONE 0.35 MG PO TABS: 1.0000 | ORAL_TABLET | Freq: Every day | ORAL | 11 refills | Status: AC

## 2023-02-15 NOTE — Progress Notes (Signed)
Pt presents for AEX. Requesting STD testing. Pt desires BC pills. No other concerns.

## 2023-02-15 NOTE — Addendum Note (Signed)
Addended by: Flonnie Hailstone on: 02/15/2023 09:59 AM   Modules accepted: Orders

## 2023-02-15 NOTE — Progress Notes (Signed)
Established Patient Office Visit  Subjective   Patient ID: Olivia Mcfarland, female    DOB: Aug 27, 1991  Age: 32 y.o. MRN: [id number]  This is a 32 y.o. female who presents for annual exam and requests progestin-only birth control pills.  Got one dose of Depo Provera after her delivery in 2022 but has not been on contraception since then.  Does not want an IUD or Nexplanon.  If she has trouble remembering pills, will switch back to DepoProvera  Other Pertinent negatives include no abdominal pain, chills, fatigue, fever, headaches, nausea or vomiting. Nothing aggravates the symptoms.    There are no problems to display for this patient.  Past Medical History:  Diagnosis Date   Gestational diabetes mellitus (GDM), antepartum 03/18/2021   Medical history non-contributory    SVD (spontaneous vaginal delivery) 05/22/2021   Past Surgical History:  Procedure Laterality Date   NO PAST SURGERIES        Review of Systems  Constitutional:  Negative for chills, fatigue and fever.  Gastrointestinal:  Negative for abdominal pain, nausea and vomiting.  Neurological:  Negative for headaches.      Objective:     BP 137/87   Pulse 83   Ht '5\' 2"'$  (1.575 m)   Wt 189 lb (85.7 kg)   LMP 02/01/2023   BMI 34.57 kg/m  BP Readings from Last 3 Encounters:  02/15/23 137/87  11/25/21 (!) 138/91  07/03/21 128/89   Wt Readings from Last 3 Encounters:  02/15/23 189 lb (85.7 kg)  07/03/21 188 lb 12.8 oz (85.6 kg)  05/21/21 202 lb (91.6 kg)      Physical Exam Constitutional:      General: She is not in acute distress.    Appearance: She is not ill-appearing or toxic-appearing.  HENT:     Head: Normocephalic.  Cardiovascular:     Rate and Rhythm: Normal rate.  Pulmonary:     Effort: Pulmonary effort is normal.  Abdominal:     General: There is no distension.     Tenderness: There is no abdominal tenderness. There is no guarding.  Genitourinary:    General: Normal vulva.     Comments:  Cervix closed, normal  Firm ridge anterior cervix palpable Uterus small, anterverted Adnexal structures not palpable due to habitus, no masses appreciated Musculoskeletal:        General: Normal range of motion.     Cervical back: Normal range of motion.  Skin:    General: Skin is warm and dry.  Neurological:     General: No focal deficit present.     Mental Status: She is alert.  Psychiatric:        Mood and Affect: Mood normal.      No results found for any visits on 02/15/23.     The ASCVD Risk score (Arnett DK, et al., 2019) failed to calculate for the following reasons:   The 2019 ASCVD risk score is only valid for ages 3 to 75    Assessment & Plan:   Problem List Items Addressed This Visit   None Visit Diagnoses     Well woman exam with routine gynecological exam    -  Primary   Relevant Orders   Cytology - PAP( [facility])   HIV antibody (with reflex)   RPR   Hepatitis C Antibody   Hepatitis B Surface AntiGEN   Cervicovaginal ancillary only( [facility])      Recommend annual exam Recommend Pap every 3 years  Hansel Feinstein, CNM

## 2023-02-16 LAB — CERVICOVAGINAL ANCILLARY ONLY
Chlamydia: NEGATIVE
Comment: NEGATIVE
Comment: NEGATIVE
Comment: NORMAL
Neisseria Gonorrhea: NEGATIVE
Trichomonas: NEGATIVE

## 2023-02-16 LAB — HEPATITIS C ANTIBODY: Hep C Virus Ab: NONREACTIVE

## 2023-02-16 LAB — HIV ANTIBODY (ROUTINE TESTING W REFLEX): HIV Screen 4th Generation wRfx: NONREACTIVE

## 2023-02-16 LAB — HEPATITIS B SURFACE ANTIGEN: Hepatitis B Surface Ag: NEGATIVE

## 2023-02-16 LAB — RPR: RPR Ser Ql: NONREACTIVE

## 2023-02-17 LAB — CYTOLOGY - PAP
Comment: NEGATIVE
Diagnosis: UNDETERMINED — AB
High risk HPV: NEGATIVE

## 2023-02-18 DIAGNOSIS — Z419 Encounter for procedure for purposes other than remedying health state, unspecified: Secondary | ICD-10-CM | POA: Diagnosis not present

## 2023-03-21 DIAGNOSIS — Z419 Encounter for procedure for purposes other than remedying health state, unspecified: Secondary | ICD-10-CM | POA: Diagnosis not present

## 2023-04-20 DIAGNOSIS — Z419 Encounter for procedure for purposes other than remedying health state, unspecified: Secondary | ICD-10-CM | POA: Diagnosis not present

## 2023-05-21 DIAGNOSIS — Z419 Encounter for procedure for purposes other than remedying health state, unspecified: Secondary | ICD-10-CM | POA: Diagnosis not present

## 2023-06-20 DIAGNOSIS — Z419 Encounter for procedure for purposes other than remedying health state, unspecified: Secondary | ICD-10-CM | POA: Diagnosis not present

## 2023-07-21 DIAGNOSIS — Z419 Encounter for procedure for purposes other than remedying health state, unspecified: Secondary | ICD-10-CM | POA: Diagnosis not present

## 2023-08-21 DIAGNOSIS — Z419 Encounter for procedure for purposes other than remedying health state, unspecified: Secondary | ICD-10-CM | POA: Diagnosis not present

## 2023-09-20 DIAGNOSIS — Z419 Encounter for procedure for purposes other than remedying health state, unspecified: Secondary | ICD-10-CM | POA: Diagnosis not present

## 2023-10-21 DIAGNOSIS — Z419 Encounter for procedure for purposes other than remedying health state, unspecified: Secondary | ICD-10-CM | POA: Diagnosis not present

## 2023-11-20 DIAGNOSIS — Z419 Encounter for procedure for purposes other than remedying health state, unspecified: Secondary | ICD-10-CM | POA: Diagnosis not present

## 2023-12-21 DIAGNOSIS — Z419 Encounter for procedure for purposes other than remedying health state, unspecified: Secondary | ICD-10-CM | POA: Diagnosis not present

## 2024-01-21 DIAGNOSIS — Z419 Encounter for procedure for purposes other than remedying health state, unspecified: Secondary | ICD-10-CM | POA: Diagnosis not present

## 2024-02-18 DIAGNOSIS — Z419 Encounter for procedure for purposes other than remedying health state, unspecified: Secondary | ICD-10-CM | POA: Diagnosis not present

## 2024-03-29 ENCOUNTER — Other Ambulatory Visit: Payer: Self-pay | Admitting: Advanced Practice Midwife

## 2024-03-31 DIAGNOSIS — Z419 Encounter for procedure for purposes other than remedying health state, unspecified: Secondary | ICD-10-CM | POA: Diagnosis not present

## 2024-04-30 DIAGNOSIS — Z419 Encounter for procedure for purposes other than remedying health state, unspecified: Secondary | ICD-10-CM | POA: Diagnosis not present

## 2024-05-31 DIAGNOSIS — Z419 Encounter for procedure for purposes other than remedying health state, unspecified: Secondary | ICD-10-CM | POA: Diagnosis not present

## 2024-06-30 DIAGNOSIS — Z419 Encounter for procedure for purposes other than remedying health state, unspecified: Secondary | ICD-10-CM | POA: Diagnosis not present

## 2024-07-26 DIAGNOSIS — N898 Other specified noninflammatory disorders of vagina: Secondary | ICD-10-CM | POA: Diagnosis not present

## 2024-07-26 DIAGNOSIS — R051 Acute cough: Secondary | ICD-10-CM | POA: Diagnosis not present

## 2024-07-26 DIAGNOSIS — Z0000 Encounter for general adult medical examination without abnormal findings: Secondary | ICD-10-CM | POA: Diagnosis not present

## 2024-07-26 DIAGNOSIS — Z8742 Personal history of other diseases of the female genital tract: Secondary | ICD-10-CM | POA: Diagnosis not present

## 2024-07-31 DIAGNOSIS — Z419 Encounter for procedure for purposes other than remedying health state, unspecified: Secondary | ICD-10-CM | POA: Diagnosis not present

## 2024-08-31 DIAGNOSIS — Z419 Encounter for procedure for purposes other than remedying health state, unspecified: Secondary | ICD-10-CM | POA: Diagnosis not present
